# Patient Record
Sex: Male | Born: 1955 | Race: White | Hispanic: No | Marital: Married | Smoking: Former smoker
Health system: Southern US, Community
[De-identification: ages and names within clinical notes are randomized; demographics above are authoritative.]

## PROBLEM LIST (undated history)

## (undated) DIAGNOSIS — F528 Other sexual dysfunction not due to a substance or known physiological condition: Secondary | ICD-10-CM

## (undated) DIAGNOSIS — E785 Hyperlipidemia, unspecified: Secondary | ICD-10-CM

## (undated) DIAGNOSIS — M1990 Unspecified osteoarthritis, unspecified site: Secondary | ICD-10-CM

## (undated) DIAGNOSIS — L408 Other psoriasis: Secondary | ICD-10-CM

## (undated) DIAGNOSIS — I4510 Unspecified right bundle-branch block: Secondary | ICD-10-CM

## (undated) DIAGNOSIS — L4050 Arthropathic psoriasis, unspecified: Secondary | ICD-10-CM

## (undated) DIAGNOSIS — G4700 Insomnia, unspecified: Secondary | ICD-10-CM

## (undated) DIAGNOSIS — E039 Hypothyroidism, unspecified: Principal | ICD-10-CM

## (undated) DIAGNOSIS — B1710 Acute hepatitis C without hepatic coma: Secondary | ICD-10-CM

## (undated) HISTORY — DX: Hyperlipidemia, unspecified: E78.5

## (undated) HISTORY — DX: Other sexual dysfunction not due to a substance or known physiological condition: F52.8

## (undated) HISTORY — DX: Other psoriasis: L40.8

## (undated) HISTORY — DX: Acute hepatitis C without hepatic coma: B17.10

## (undated) HISTORY — DX: Unspecified osteoarthritis, unspecified site: M19.90

## (undated) HISTORY — DX: Unspecified right bundle-branch block: I45.10

## (undated) HISTORY — DX: Hypothyroidism, unspecified: E03.9

## (undated) HISTORY — DX: Insomnia, unspecified: G47.00

## (undated) HISTORY — DX: Arthropathic psoriasis, unspecified: L40.50

---

## 2005-06-10 ENCOUNTER — Ambulatory Visit: Payer: Self-pay | Admitting: Internal Medicine

## 2005-06-13 ENCOUNTER — Ambulatory Visit: Payer: Self-pay | Admitting: Internal Medicine

## 2005-11-08 ENCOUNTER — Ambulatory Visit: Payer: Self-pay | Admitting: Endocrinology

## 2006-05-30 ENCOUNTER — Ambulatory Visit: Payer: Self-pay | Admitting: Gastroenterology

## 2006-05-31 ENCOUNTER — Ambulatory Visit: Payer: Self-pay | Admitting: Internal Medicine

## 2006-06-19 ENCOUNTER — Ambulatory Visit: Payer: Self-pay | Admitting: Internal Medicine

## 2006-06-19 LAB — CONVERTED CEMR LAB
ALT: 28 units/L (ref 0–40)
AST: 23 units/L (ref 0–37)
Basophils Absolute: 0.1 10*3/uL (ref 0.0–0.1)
Bilirubin Urine: NEGATIVE
Calcium: 9.9 mg/dL (ref 8.4–10.5)
Chloride: 105 meq/L (ref 96–112)
Creatinine, Ser: 0.8 mg/dL (ref 0.4–1.5)
Crystals: NEGATIVE
GFR calc Af Amer: 131 mL/min
GFR calc non Af Amer: 108 mL/min
Hemoglobin, Urine: NEGATIVE
Hemoglobin: 16.2 g/dL (ref 13.0–17.0)
Hgb A1c MFr Bld: 5.6 % (ref 4.6–6.0)
Lymphocytes Relative: 13.2 % (ref 12.0–46.0)
MCHC: 33.8 g/dL (ref 30.0–36.0)
MCV: 88 fL (ref 78.0–100.0)
Monocytes Absolute: 0.8 10*3/uL — ABNORMAL HIGH (ref 0.2–0.7)
Monocytes Relative: 9.6 % (ref 3.0–11.0)
Neutro Abs: 5.8 10*3/uL (ref 1.4–7.7)
Neutrophils Relative %: 73.8 % (ref 43.0–77.0)
Nitrite: NEGATIVE
Potassium: 3.9 meq/L (ref 3.5–5.1)
Sed Rate: 10 mm/hr (ref 0–20)
Sodium: 143 meq/L (ref 135–145)
Specific Gravity, Urine: >=1.03 (ref 1.000–1.03)
TSH: 1.46 microintl units/mL (ref 0.35–5.50)
Total Protein, Urine: 30 mg/dL — AB
Urine Glucose: NEGATIVE mg/dL
Urobilinogen, UA: 0.2 (ref 0.0–1.0)

## 2006-07-21 ENCOUNTER — Ambulatory Visit: Payer: Self-pay | Admitting: Gastroenterology

## 2007-02-12 ENCOUNTER — Encounter: Payer: Self-pay | Admitting: *Deleted

## 2007-02-12 DIAGNOSIS — L408 Other psoriasis: Secondary | ICD-10-CM

## 2007-02-12 DIAGNOSIS — F528 Other sexual dysfunction not due to a substance or known physiological condition: Secondary | ICD-10-CM | POA: Insufficient documentation

## 2007-02-12 DIAGNOSIS — B1710 Acute hepatitis C without hepatic coma: Secondary | ICD-10-CM

## 2007-02-12 DIAGNOSIS — B182 Chronic viral hepatitis C: Secondary | ICD-10-CM

## 2007-02-12 DIAGNOSIS — B338 Other specified viral diseases: Secondary | ICD-10-CM

## 2007-02-12 HISTORY — DX: Acute hepatitis C without hepatic coma: B17.10

## 2007-02-12 HISTORY — DX: Other psoriasis: L40.8

## 2007-02-12 HISTORY — DX: Other sexual dysfunction not due to a substance or known physiological condition: F52.8

## 2007-03-16 ENCOUNTER — Telehealth: Payer: Self-pay | Admitting: Internal Medicine

## 2007-04-23 ENCOUNTER — Ambulatory Visit: Payer: Self-pay | Admitting: Internal Medicine

## 2007-04-23 DIAGNOSIS — L4050 Arthropathic psoriasis, unspecified: Secondary | ICD-10-CM | POA: Insufficient documentation

## 2007-04-23 DIAGNOSIS — E785 Hyperlipidemia, unspecified: Secondary | ICD-10-CM

## 2007-04-23 DIAGNOSIS — E039 Hypothyroidism, unspecified: Secondary | ICD-10-CM

## 2007-04-23 HISTORY — DX: Arthropathic psoriasis, unspecified: L40.50

## 2007-04-23 HISTORY — DX: Hypothyroidism, unspecified: E03.9

## 2007-07-18 ENCOUNTER — Ambulatory Visit: Payer: Self-pay | Admitting: Internal Medicine

## 2007-07-25 ENCOUNTER — Encounter: Payer: Self-pay | Admitting: Internal Medicine

## 2007-08-02 LAB — CONVERTED CEMR LAB
AST: 23 units/L (ref 0–37)
Albumin: 4 g/dL (ref 3.5–5.2)
Alkaline Phosphatase: 76 units/L (ref 39–117)
Basophils Absolute: 0.1 10*3/uL (ref 0.0–0.1)
Basophils Relative: 0.7 % (ref 0.0–1.0)
CO2: 31 meq/L (ref 19–32)
Chloride: 103 meq/L (ref 96–112)
Creatinine, Ser: 0.8 mg/dL (ref 0.4–1.5)
HCT: 47.3 % (ref 39.0–52.0)
HDL: 34.1 mg/dL — ABNORMAL LOW (ref >39.0)
Hemoglobin, Urine: NEGATIVE
Hemoglobin: 15.4 g/dL (ref 13.0–17.0)
Ketones, ur: NEGATIVE mg/dL
LDL Cholesterol: 151 mg/dL — ABNORMAL HIGH (ref 0–99)
Leukocytes, UA: NEGATIVE
MCHC: 32.6 g/dL (ref 30.0–36.0)
Monocytes Absolute: 0.8 10*3/uL — ABNORMAL HIGH (ref 0.2–0.7)
Neutrophils Relative %: 63.4 % (ref 43.0–77.0)
Nitrite: NEGATIVE
RBC: 5.36 M/uL (ref 4.22–5.81)
RDW: 13.1 % (ref 11.5–14.6)
Sodium: 138 meq/L (ref 135–145)
Specific Gravity, Urine: 1.025 (ref 1.000–1.03)
Total Bilirubin: 1 mg/dL (ref 0.3–1.2)
Total Protein, Urine: NEGATIVE mg/dL
Total Protein: 7.3 g/dL (ref 6.0–8.3)
Urobilinogen, UA: 0.2 (ref 0.0–1.0)
VLDL: 13 mg/dL (ref 0–40)
WBC: 7.9 10*3/uL (ref 4.5–10.5)
pH: 5.5 (ref 5.0–8.0)

## 2007-08-15 ENCOUNTER — Ambulatory Visit: Payer: Self-pay | Admitting: Internal Medicine

## 2007-08-15 DIAGNOSIS — I4510 Unspecified right bundle-branch block: Secondary | ICD-10-CM

## 2007-08-15 HISTORY — DX: Unspecified right bundle-branch block: I45.10

## 2007-08-28 ENCOUNTER — Encounter: Admission: RE | Admit: 2007-08-28 | Discharge: 2007-08-28 | Payer: Self-pay | Admitting: Internal Medicine

## 2007-09-10 ENCOUNTER — Encounter: Payer: Self-pay | Admitting: Internal Medicine

## 2008-02-15 ENCOUNTER — Ambulatory Visit: Payer: Self-pay | Admitting: Internal Medicine

## 2008-04-11 ENCOUNTER — Ambulatory Visit: Payer: Self-pay | Admitting: Internal Medicine

## 2008-06-12 ENCOUNTER — Ambulatory Visit: Payer: Self-pay | Admitting: Internal Medicine

## 2008-06-12 DIAGNOSIS — G4700 Insomnia, unspecified: Secondary | ICD-10-CM | POA: Insufficient documentation

## 2008-06-12 HISTORY — DX: Insomnia, unspecified: G47.00

## 2008-06-26 ENCOUNTER — Telehealth: Payer: Self-pay | Admitting: Internal Medicine

## 2008-11-10 ENCOUNTER — Telehealth: Payer: Self-pay | Admitting: Internal Medicine

## 2009-03-17 ENCOUNTER — Ambulatory Visit: Payer: Self-pay | Admitting: Internal Medicine

## 2009-03-17 LAB — CONVERTED CEMR LAB
ALT: 34 units/L (ref 0–53)
AST: 30 units/L (ref 0–37)
Alkaline Phosphatase: 73 units/L (ref 39–117)
Basophils Relative: 0.9 % (ref 0.0–3.0)
Bilirubin Urine: NEGATIVE
Bilirubin, Direct: 0.1 mg/dL (ref 0.0–0.3)
Calcium: 9.3 mg/dL (ref 8.4–10.5)
Creatinine, Ser: 0.6 mg/dL (ref 0.4–1.5)
Eosinophils Absolute: 0.3 10*3/uL (ref 0.0–0.7)
Eosinophils Relative: 3.6 % (ref 0.0–5.0)
GFR calc non Af Amer: 148.56 mL/min (ref >60)
HDL: 38.3 mg/dL — ABNORMAL LOW (ref >39.00)
Hemoglobin: 15.3 g/dL (ref 13.0–17.0)
LDL Cholesterol: 132 mg/dL — ABNORMAL HIGH (ref 0–99)
Lymphocytes Relative: 23.4 % (ref 12.0–46.0)
Monocytes Relative: 8.8 % (ref 3.0–12.0)
Neutrophils Relative %: 63.3 % (ref 43.0–77.0)
Nitrite: NEGATIVE
RBC: 4.86 M/uL (ref 4.22–5.81)
Sodium: 139 meq/L (ref 135–145)
Total CHOL/HDL Ratio: 5
Total Protein, Urine: NEGATIVE mg/dL
Total Protein: 7.1 g/dL (ref 6.0–8.3)
Triglycerides: 61 mg/dL (ref 0.0–149.0)
Urine Glucose: NEGATIVE mg/dL
WBC: 7.4 10*3/uL (ref 4.5–10.5)
pH: 5.5 (ref 5.0–8.0)

## 2009-03-20 ENCOUNTER — Ambulatory Visit: Payer: Self-pay | Admitting: Internal Medicine

## 2009-03-20 DIAGNOSIS — Z87891 Personal history of nicotine dependence: Secondary | ICD-10-CM

## 2009-03-20 DIAGNOSIS — H6120 Impacted cerumen, unspecified ear: Secondary | ICD-10-CM

## 2009-04-09 ENCOUNTER — Telehealth (INDEPENDENT_AMBULATORY_CARE_PROVIDER_SITE_OTHER): Payer: Self-pay | Admitting: *Deleted

## 2009-09-03 ENCOUNTER — Telehealth: Payer: Self-pay | Admitting: Internal Medicine

## 2009-09-04 ENCOUNTER — Telehealth: Payer: Self-pay | Admitting: Internal Medicine

## 2009-11-26 ENCOUNTER — Ambulatory Visit: Payer: Self-pay | Admitting: Internal Medicine

## 2010-05-12 ENCOUNTER — Ambulatory Visit: Payer: Self-pay | Admitting: Internal Medicine

## 2010-06-13 ENCOUNTER — Encounter: Payer: Self-pay | Admitting: Internal Medicine

## 2010-06-22 NOTE — Assessment & Plan Note (Signed)
Summary: COLD/ AVP'S PT/NWS   Vital Signs:  Patient profile:   55 year old male Height:      66 inches Weight:      183.75 pounds BMI:     29.77 O2 Sat:      96 % on Room air Temp:     98.3 degrees F oral Pulse rate:   76 / minute BP sitting:   130 / 78  (left arm) Cuff size:   regular  Vitals Entered By: Zella Ball Ewing CMA (AAMA) (November 26, 2009 1:20 PM)  O2 Flow:  Room air CC: cough, congestion, sneezing/RE   CC:  cough, congestion, and sneezing/RE.  History of Present Illness: here with acute onset x 2 days facial pain, pressure, fever and greneish d/c;  has mild sT, but Pt denies CP, sob, doe, wheezing, orthopnea, pnd, worsening LE edema, palps, dizziness or syncope  Pt denies new neuro symptoms such as headache, facial or extremity weakness   Problems Prior to Update: 1)  Sinusitis- Acute-nos  (ICD-461.9) 2)  Cerumen Impaction  (ICD-380.4) 3)  Tobacco Use, Quit  (ICD-V15.82) 4)  Insomnia, Persistent  (ICD-307.42) 5)  Sinusitis, Acute  (ICD-461.9) 6)  Bundle Branch Block, Right  (ICD-426.4) 7)  Observation For Suspected Malignant Neoplasm  (ICD-V71.1) 8)  Routine General Medical Exam@health  [facility]  (ICD-V70.0) 9)  Hyperlipidemia  (ICD-272.4) 10)  Erectile Dysfunction  (ICD-302.72) 11)  Psoriatic Arthritis  (ICD-696.0) 12)  Psoriasis  (ICD-696.1) 13)  Hypothyroidism  (ICD-244.9) 14)  Sinusitis- Acute-nos  (ICD-461.9) 15)  Erectile Dysfunction  (ICD-302.72) 16)  Arthritis W/viral Disease, 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 1 By Mouth Daily 2)  Aspirin 81 Mg  Tbec (Aspirin) .... One By Mouth Every Day 3)  Fish Oil   Oil (Fish Oil) .... 2 By Mouth Two Times A Day 4)  Clobetasol Propionate 0.05 % Crea (Clobetasol Propionate) .... Two Times A Day 5)  Cormax 0.05 % Soln (Clobetasol Propionate) .... Use  Two Times A Day Prn 6)  Viagra  100 Mg Tabs (Sildenafil Citrate) .[name].. 1 By Mouth Once Daily Prn 7)  Levothyroxine Sodium 137 Mcg  Tabs (Levothyroxine Sodium) .[name].. 1 By Mouth Daily  Current Medications (verified): 1)  Vitamin D3 1000 Unit  Tabs (Cholecalciferol) .[name].. 1 By Mouth Daily 2)  Aspirin 81 Mg  Tbec (Aspirin) .... One By Mouth Every Day 3)  Fish Oil   Oil (Fish Oil) .... 2 By Mouth Two Times A Day 4)  Clobetasol Propionate 0.05 % Crea (Clobetasol Propionate) .... Two Times A Day 5)  Cormax 0.05 % Soln (Clobetasol Propionate) .... Use  Two Times A Day Prn 6)  Viagra 100 Mg Tabs (Sildenafil Citrate) .[name].. 1 By Mouth Once Daily Prn 7)  Levothyroxine Sodium 137 Mcg  Tabs (Levothyroxine Sodium) .[name].. 1 By Mouth Daily 8)  Clarithromycin 500 Mg Tabs (Clarithromycin) .[name].. 1 By Mouth Two Times A Day  Allergies (verified): 1)  Mevacor  Past History:  Past Medical History: Last updated: 03/20/2009 Hepatitis C Hypothyroidism psoriasis psoriatic arthritis E.D. RBBB(present for long time) Hyperlipidemia  Past Surgical History: Last updated: 04/23/2007 Denies surgical history  Social History: Last updated: 02/15/2008 Former Smoker Alcohol use-no Occupation: pilot  Risk Factors: Smoking Status: quit (03/20/2009)  Review of Systems       all otherwise negative per pt -  Physical Exam  General:  alert and overweight-appearing.  mild ill  Head:  normocephalic and atraumatic.   Eyes:  vision grossly intact, pupils equal, and pupils round.   Ears:  bilat tm's red, sinus tender bilat Nose:  nasal dischargemucosal pallor and mucosal edema.   Mouth:  pharyngeal erythema and fair dentition.   Neck:  supple and cervical lymphadenopathy.   Lungs:  normal respiratory effort and normal breath sounds.   Heart:  normal rate and regular rhythm.   Extremities:  no edema, no erythema    Impression & Recommendations:  Problem # 1:  SINUSITIS- ACUTE-NOS (ICD-461.9)  His updated medication list for this problem  includes:    Clarithromycin 500 Mg Tabs (Clarithromycin) .[name]... 1 by mouth two times a day treat as above, f/u any worsening signs or symptoms   Complete Medication List: 1)  Vitamin D3 1000 Unit Tabs (Cholecalciferol) .[name].. 1 by mouth daily 2)  Aspirin 81 Mg Tbec (Aspirin) .... One by mouth every day 3)  Fish Oil Oil (Fish oil) .... 2 by mouth two times a day 4)  Clobetasol Propionate 0.05 % Crea (Clobetasol propionate) .... Two times a day 5)  Cormax 0.05 % Soln (Clobetasol propionate) .... Use  two times a day prn 6)  Viagra 100 Mg Tabs (Sildenafil citrate) .[name].. 1 by mouth once daily prn 7)  Levothyroxine Sodium 137 Mcg Tabs (Levothyroxine sodium) .[name].. 1 by mouth daily 8)  Clarithromycin 500 Mg Tabs (Clarithromycin) .[name].. 1 by mouth two times a day  Patient Instructions: 1)  Please take all new medications as prescribed 2)  Continue all previous medications as before this visit  3)  You can also use Mucinex OTC or it's generic for congestion  4)  Please schedule an appointment with your primary doctor as needed Prescriptions: CLARITHROMYCIN 500 MG TABS (CLARITHROMYCIN) 1 by mouth two times a day  #20 x 0   Entered and Authorized by:   Corwin Levins MD   Signed by:   Corwin Levins MD on 11/26/2009   Method used:   Print then Give to Patient   RxID:   [id number]

## 2010-06-22 NOTE — Progress Notes (Signed)
Summary: Viagra refill  Phone Note Refill Request Message from:  Fax from Pharmacy on September 03, 2009 4:58 PM  Refills Requested: Medication #1:  VIAGRA 100 MG TABS 1 by mouth once daily prn Initial call taken by: Lucious Groves,  September 03, 2009 4:58 PM

## 2010-06-22 NOTE — Progress Notes (Signed)
Summary: Viagra refill  Phone Note Refill Request Message from:  Fax from Pharmacy on September 04, 2009 8:29 AM  Refills Requested: Medication #1:  VIAGRA 100 MG TABS 1 by mouth once daily prn Initial call taken by: Lucious Groves,  September 04, 2009 8:29 AM  Follow-up for Phone Call        OK x12 Follow-up by: Tresa Garter MD,  September 04, 2009 1:12 PM    Prescriptions: VIAGRA 100 MG TABS (SILDENAFIL CITRATE) 1 by mouth once daily prn  #12 x 12   Entered by:   Lamar Sprinkles, CMA   Authorized by:   Tresa Garter MD   Signed by:   Lamar Sprinkles, CMA on 09/04/2009   Method used:   [facility] [address]       [address]       [place], [place]  [address]       Ph: [phone] or [phone]       Fax: [phone]   RxID:   [id number]

## 2010-06-24 NOTE — Assessment & Plan Note (Signed)
Summary: CONGESTION--RUNNY NOSE   STC   Vital Signs:  Patient profile:   55 year old male Height:      66 inches Weight:      192 pounds BMI:     31.10 Temp:     98.9 degrees F oral Pulse rate:   80 / minute Pulse rhythm:   regular Resp:     16 per minute BP sitting:   138 / 90  (left arm) Cuff size:   regular  Vitals Entered By: Lanier Prude, Beverly Gust) (May 12, 2010 10:29 AM) CC: sore throat, sinus congestion X 3-4 days Is Patient Diabetic? No   CC:  sore throat and sinus congestion X 3-4 days.  History of Present Illness: The patient presents with complaints of sore throat, fever, cough, sinus congestion and drainge of several days duration. Not better with OTC meds. Chest hurts with coughing. The mucus is colored.   Current Medications (verified): 1)  Vitamin D3 1000 Unit  Tabs (Cholecalciferol) .[name].. 1 By Mouth Daily 2)  Aspirin 81 Mg  Tbec (Aspirin) .... One By Mouth Every Day 3)  Fish Oil   Oil (Fish Oil) .... 2 By Mouth Two Times A Day 4)  Clobetasol Propionate 0.05 % Crea (Clobetasol Propionate) .... Two Times A Day 5)  Cormax 0.05 % Soln (Clobetasol Propionate) .... Use  Two Times A Day Prn 6)  Viagra 100 Mg Tabs (Sildenafil Citrate) .[name].. 1 By Mouth Once Daily Prn 7)  Levothyroxine Sodium 137 Mcg  Tabs (Levothyroxine Sodium) .[name].. 1 By Mouth Daily 8)  Clarithromycin 500 Mg Tabs (Clarithromycin) .[name].. 1 By Mouth Two Times A Day  Allergies (verified): 1)  Mevacor  Review of Systems  The patient denies chest pain, dyspnea on exertion, and prolonged cough.         ST  Physical Exam  General:  NAD Mouth:  Erythematous throat and intranasal mucosa c/w URI  Lungs:  normal respiratory effort and normal breath sounds.   Heart:  normal rate and regular rhythm.   Abdomen:  Bowel sounds positive,abdomen soft and non-tender without masses, organomegaly or hernias noted. Psych:  Cognition and judgment appear intact. Alert and cooperative with normal attention span  and concentration. No apparent delusions, illusions, hallucinations   Impression & Recommendations:  Problem # 1:  SINUSITIS- ACUTE-NOS (ICD-461.9) Assessment New  The following medications were removed from the medication list:    Clarithromycin 500 Mg Tabs (Clarithromycin) .[name]... 1 by mouth two times a day His updated medication list for this problem includes:    Sudafed 12 Hour 120 Mg Xr12h-tab (Pseudoephedrine hcl) .[name]... 1 by mouth two times a day as needed allergies    Zithromax Z-pak 250 Mg Tabs (Azithromycin) .[name]... As dirrected  Complete Medication List: 1)  Vitamin D3 1000 Unit Tabs (Cholecalciferol) .[name].. 1 by mouth daily 2)  Aspirin 81 Mg Tbec (Aspirin) .... One by mouth every day 3)  Fish Oil Oil (Fish oil) .... 2 by mouth two times a day 4)  Clobetasol Propionate 0.05 % Crea (Clobetasol propionate) .... Two times a day 5)  Cormax 0.05 % Soln (Clobetasol propionate) .... Use  two times a day prn 6)  Viagra 100 Mg Tabs (Sildenafil citrate) .[name].. 1 by mouth once daily prn 7)  Levothyroxine Sodium 137 Mcg Tabs (Levothyroxine sodium) .[name].. 1 by mouth daily 8)  Sudafed 12 Hour 120 Mg Xr12h-tab (Pseudoephedrine hcl) .[name].. 1 by mouth two times a day as needed allergies 9)  Zithromax Z-pak 250 Mg  Tabs (Azithromycin) .... As dirrected  Patient Instructions: 1)  Use over-the-counter medicines for "cold": Tylenol  650mg  or Advil 400mg  every 6 hours  for fever; Delsym or Robutussin for cough. Mucinex for congestion. Ricola or Halls for sore throat. Office visit if not better or if worse.  Prescriptions: ZITHROMAX Z-PAK 250 MG TABS (AZITHROMYCIN) as dirrected  #1 x 0   Entered and Authorized by:   Tresa Garter MD   Signed by:   Tresa Garter MD on 05/12/2010   Method used:   Electronically to        [facility] [address] [phone][facility] (retail)       [address] [place], [place]  [address]       Ph: [phone]       Fax: [phone]   RxID:   [id number] SUDAFED 12 HOUR 120  MG XR12H-TAB (PSEUDOEPHEDRINE HCL) 1 by mouth two times a day as needed allergies  #60 x 1   Entered and Authorized by:   Tresa Garter MD   Signed by:   Tresa Garter MD on 05/12/2010   Method used:   Electronically to        [facility] [address] [phone]* (retail)       [address] [place], [place]  [address]       Ph: [phone]       Fax: [phone]   RxID:   [id number]    Orders Added: 1)  Est. Patient Level III [[address]]

## 2010-06-29 ENCOUNTER — Telehealth: Payer: Self-pay | Admitting: Internal Medicine

## 2010-07-08 NOTE — Progress Notes (Signed)
Summary: RF?   Phone Note Call from Patient Call back at [facility] Phone [phone]   Summary of Call: Patient is requesting refill of thyroid med. Last labs were 02/2009.  Initial call taken by: Lamar Sprinkles, CMA,  June 29, 2010 9:29 AM  Follow-up for Phone Call        ok x 3 months Sch CPX w/labs Thank you!  Follow-up by: Tresa Garter MD,  June 29, 2010 10:41 AM    Prescriptions: LEVOTHYROXINE SODIUM 137 MCG  TABS (LEVOTHYROXINE SODIUM) 1 by mouth daily  #30 x 3   Entered by:   Rock Nephew CMA   Authorized by:   Tresa Garter MD   Signed by:   Rock Nephew CMA on 06/29/2010   Method used:   [facility]  [place] [address]       [address]       [place], [place]  [address]       Ph: [phone] or [phone]       Fax: [phone]   RxID:   [id number]

## 2010-08-18 ENCOUNTER — Telehealth: Payer: Self-pay | Admitting: *Deleted

## 2010-08-18 NOTE — Telephone Encounter (Signed)
Patient requesting 90 day supply of Levothyroxine 137 with rfs to be mailed to pt's home. OK? Last time rx was requested you noted pt needed cpx. Ok for #90 w/0rfs?

## 2010-08-19 NOTE — Telephone Encounter (Signed)
OK #90 Thank you!

## 2010-08-20 MED ORDER — LEVOTHYROXINE SODIUM 137 MCG PO TABS: 137.0000 ug | ORAL_TABLET | Freq: Every day | ORAL | Status: DC

## 2010-08-20 NOTE — Telephone Encounter (Signed)
Mailed to pt

## 2010-10-08 NOTE — Assessment & Plan Note (Signed)
Eric Griffith                           PRIMARY CARE OFFICE NOTE   Eric Griffith, Eric Griffith                MRN:          [id number]  DATE:06/19/2006                            DOB:          Mar 02, 1956    SEPARATE EVALUATION AND MANAGEMENT:   We need to address today problems with psoriasis, arthropathy, and  hepatitis C.  Eric Griffith is complaining of erectile dysfunction of  several months' duration.  Since Saturday has been sick with sore  throat, voice loss, cough productive for yellow sputum.  Past medical  history, family history, social history as per June 13, 2005, note.   ALLERGIES:  No known drug allergies.   CURRENT MEDICINES:  Reviewed with the patient.   REVIEW OF SYSTEMS:  As above; the rest of 18-point system review of  systems is negative.   PHYSICAL EXAMINATION:  GENERAL:  He looks well.  He is in no acute  distress.  VITAL SIGNS:  Blood pressure 138/92, pulse 72, temperature 97.2, weight  192 pounds.  HEENT:  Moist mucosa.  NECK:  Supple, no thyromegaly or bruit.  LUNGS:  Clear, no wheezes or rales.  HEART:  S1, S2.  No murmur, no gallop.  ABDOMEN:  Soft, nontender, no organomegaly or mass felt.  LOWER EXTREMITIES:  Without edema.  NEUROLOGIC:  He is alert, oriented and cooperative.  Denies being  depressed.  SKIN:  Examination reveals confluent patches over  with scaling between  the buttocks, on the lower extremities, some on the scalp.  JOINTS:  Without deformities.  RECTAL:  Examination reveals normal-size prostate, stool guaiac  negative, no masses.   LABORATORY:  EKG with right bundle-branch block, normal sinus rhythm.   ASSESSMENT AND PLAN:  1. 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  He is status post      rheumatologic evaluation with Dr. Phylliss Bob.  He is doing very well on      prednisone 5 mg daily.  My only concern is his chronic hepatitis C      here.  I gave him 1-mg tablets and asked him to take the  lowest      dose that controls his symptoms.  2. Hepatitis C.  He was seen at [facility].  He will discuss the      issue with Dr. Phylliss Bob.  3. Psoriasis.  He is seeing Dr. Terri Piedra today, using topical treatment.  4. Erectile dysfunction.  Prescribed Levitra 20 mg one-half to one      daily p.r.n.  I also gave him samples of Cialis 20 mg every 3 days      p.r.n. to see what works better.  5. Upper respiratory infection.  I gave him a prescription for a Z-Pak      for 5 days if starts to cough up green sputum.  If he is getting      better, he will not take it.     Georgina Quint. Plotnikov, MD  Electronically Signed    AVP/MedQ  DD: 06/19/2006  DT: 06/19/2006  Job #: [id number]   cc:   Areatha Keas, M.D.  Frederick A. Terri Piedra III,  M.D. 

## 2010-10-08 NOTE — Assessment & Plan Note (Signed)
[facility]                           PRIMARY CARE OFFICE NOTE   Eric Griffith, Eric Griffith                MRN:          [id number]  DATE:06/19/2006                            DOB:          1956/02/27    The patient is a 55 year old male who presents for a wellness  examination.  Past medical history, family history, social history as  per June 13, 2005, note.   CURRENT MEDICINES:  1. Synthroid 160 mcg daily.  2. Prednisone 5 mg daily.   ALLERGIES:  No known drug allergies.   REVIEW OF SYSTEMS:  Arthropathies, much better on prednisone.  Continues  to have persistent skin lesions.  Energy level is good.  No GI  complaints.  No vision problems.  No dry mouth.  Gets up at night once.  The rest of the 18-point review of systems is negative.   ALLERGIES:  No known drug allergies.   PHYSICAL EXAMINATION:  GENERAL:  He looks well.  He is in no acute  distress.  VITAL SIGNS:  Blood pressure 138/92, pulse 72, temperature 97.2, weight  192 pounds.  HEENT:  Moist mucosa.  NECK:  Supple, no thyromegaly or bruit.  LUNGS:  Clear, no wheezes or rales.  HEART:  S1, S2.  No murmur, no gallop.  ABDOMEN:  Soft, nontender, no organomegaly or mass felt.  LOWER EXTREMITIES:  Without edema.  NEUROLOGIC:  He is alert, oriented and cooperative.  Denies being  depressed.  SKIN:  Examination reveals confluent patches over edema with scaling  between the buttocks, on the lower extremities, some on the scalp.  JOINTS:  Without deformities.  RECTAL:  Examination reveals normal-size prostate, stool guaiac  negative, no masses.   ASSESSMENT AND PLAN:  Normal wellness examination.  Age/health-related  issues discussed.  Healthy lifestyle discussed.  Baby aspirin daily.  He  will see Dr. Arlyce Dice for a colonoscopy in the near future.  His EKG today  reveals normal sinus rhythm with a right bundle-branch block.  Repeat  exam in 12 months.     Georgina Quint. Plotnikov,  MD  Electronically Signed    AVP/MedQ  DD: 06/19/2006  DT: 06/19/2006  Job #: [id number]

## 2011-03-25 ENCOUNTER — Other Ambulatory Visit: Payer: Self-pay | Admitting: Internal Medicine

## 2011-03-25 MED ORDER — LEVOTHYROXINE SODIUM 137 MCG PO TABS: 137.0000 ug | ORAL_TABLET | Freq: Every day | ORAL | Status: DC

## 2011-03-25 NOTE — Telephone Encounter (Signed)
Left detailed mess informing pt rf sent.

## 2011-03-25 NOTE — Telephone Encounter (Signed)
The pt is requesting a med refill of levothyroxine to the [facility] in [place] to hold him over until his cpe appt on 1/03.   Thanks!

## 2011-05-23 DIAGNOSIS — E039 Hypothyroidism, unspecified: Secondary | ICD-10-CM

## 2011-05-26 ENCOUNTER — Ambulatory Visit (INDEPENDENT_AMBULATORY_CARE_PROVIDER_SITE_OTHER): Payer: Federal, State, Local not specified - PPO | Admitting: Internal Medicine

## 2011-05-26 ENCOUNTER — Other Ambulatory Visit: Payer: Self-pay | Admitting: *Deleted

## 2011-05-26 ENCOUNTER — Telehealth: Payer: Self-pay | Admitting: *Deleted

## 2011-05-26 ENCOUNTER — Encounter: Payer: Self-pay | Admitting: Internal Medicine

## 2011-05-26 ENCOUNTER — Other Ambulatory Visit: Payer: Self-pay | Admitting: Internal Medicine

## 2011-05-26 ENCOUNTER — Other Ambulatory Visit (INDEPENDENT_AMBULATORY_CARE_PROVIDER_SITE_OTHER): Payer: Federal, State, Local not specified - PPO

## 2011-05-26 VITALS — BP 140/90 | HR 80 | Temp 97.9°F | Resp 16 | Wt 192.0 lb

## 2011-05-26 DIAGNOSIS — J329 Chronic sinusitis, unspecified: Secondary | ICD-10-CM

## 2011-05-26 DIAGNOSIS — N529 Male erectile dysfunction, unspecified: Secondary | ICD-10-CM

## 2011-05-26 DIAGNOSIS — Z125 Encounter for screening for malignant neoplasm of prostate: Secondary | ICD-10-CM

## 2011-05-26 DIAGNOSIS — Z23 Encounter for immunization: Secondary | ICD-10-CM

## 2011-05-26 DIAGNOSIS — R030 Elevated blood-pressure reading, without diagnosis of hypertension: Secondary | ICD-10-CM | POA: Insufficient documentation

## 2011-05-26 DIAGNOSIS — Z2911 Encounter for prophylactic immunotherapy for respiratory syncytial virus (RSV): Secondary | ICD-10-CM

## 2011-05-26 DIAGNOSIS — Z0000 Encounter for general adult medical examination without abnormal findings: Secondary | ICD-10-CM

## 2011-05-26 DIAGNOSIS — L408 Other psoriasis: Secondary | ICD-10-CM

## 2011-05-26 DIAGNOSIS — N400 Enlarged prostate without lower urinary tract symptoms: Secondary | ICD-10-CM

## 2011-05-26 LAB — CBC WITH DIFFERENTIAL/PLATELET
Basophils Absolute: 0.1 K/uL (ref 0.0–0.1)
Basophils Relative: 0.8 % (ref 0.0–3.0)
Eosinophils Absolute: 0.7 K/uL (ref 0.0–0.7)
Eosinophils Relative: 7.2 % — ABNORMAL HIGH (ref 0.0–5.0)
HCT: 48 % (ref 39.0–52.0)
Hemoglobin: 16.3 g/dL (ref 13.0–17.0)
Lymphocytes Relative: 23.1 % (ref 12.0–46.0)
Lymphs Abs: 2.1 K/uL (ref 0.7–4.0)
MCHC: 33.9 g/dL (ref 30.0–36.0)
MCV: 89.9 fl (ref 78.0–100.0)
Monocytes Absolute: 0.8 K/uL (ref 0.1–1.0)
Monocytes Relative: 8.7 % (ref 3.0–12.0)
Neutro Abs: 5.5 K/uL (ref 1.4–7.7)
Neutrophils Relative %: 60.2 % (ref 43.0–77.0)
Platelets: 295 K/uL (ref 150.0–400.0)
RBC: 5.34 Mil/uL (ref 4.22–5.81)
RDW: 13.6 % (ref 11.5–14.6)
WBC: 9.1 K/uL (ref 4.5–10.5)

## 2011-05-26 LAB — LIPID PANEL
Cholesterol: 219 mg/dL — ABNORMAL HIGH (ref 0–200)
HDL: 41.2 mg/dL
Total CHOL/HDL Ratio: 5
Triglycerides: 96 mg/dL (ref 0.0–149.0)
VLDL: 19.2 mg/dL (ref 0.0–40.0)

## 2011-05-26 LAB — HEPATIC FUNCTION PANEL
ALT: 45 U/L (ref 0–53)
Alkaline Phosphatase: 78 U/L (ref 39–117)
Bilirubin, Direct: 0.2 mg/dL (ref 0.0–0.3)
Total Protein: 7.4 g/dL (ref 6.0–8.3)

## 2011-05-26 LAB — BASIC METABOLIC PANEL
CO2: 28 mEq/L (ref 19–32)
Chloride: 104 mEq/L (ref 96–112)
Potassium: 4.3 mEq/L (ref 3.5–5.1)
Sodium: 140 mEq/L (ref 135–145)

## 2011-05-26 LAB — PSA: PSA: 1.58 ng/mL (ref 0.10–4.00)

## 2011-05-26 LAB — URINALYSIS, ROUTINE W REFLEX MICROSCOPIC
Bilirubin Urine: NEGATIVE
Leukocytes, UA: NEGATIVE
Nitrite: NEGATIVE
Specific Gravity, Urine: 1.025 (ref 1.000–1.030)
Total Protein, Urine: NEGATIVE

## 2011-05-26 LAB — TSH: TSH: 0.36 u[IU]/mL (ref 0.35–5.50)

## 2011-05-26 MED ORDER — KETOCONAZOLE 2 % EX CREA: TOPICAL_CREAM | Freq: Every day | CUTANEOUS | Status: DC

## 2011-05-26 MED ORDER — FLUTICASONE PROPIONATE 50 MCG/ACT NA SUSP: 2.0000 | Freq: Every day | NASAL | Status: DC

## 2011-05-26 MED ORDER — TADALAFIL 5 MG PO TABS: 5.0000 mg | ORAL_TABLET | Freq: Every day | ORAL | Status: AC | PRN

## 2011-05-26 MED ORDER — AMOXICILLIN-POT CLAVULANATE 875-125 MG PO TABS: 1.0000 | ORAL_TABLET | Freq: Two times a day (BID) | ORAL | Status: AC

## 2011-05-26 NOTE — Assessment & Plan Note (Signed)
Chronic. Intertrigo (buttocks)-worse. Trial of ketoconazole

## 2011-05-26 NOTE — Assessment & Plan Note (Signed)
Start Cialis

## 2011-05-26 NOTE — Telephone Encounter (Signed)
Pt called and wanted MD to be aware that when he was at home after appointment today, he did noticed some blood in his urine (small amount). Pt also states that he had immunizations for Flu and Zostavax today.

## 2011-05-26 NOTE — Assessment & Plan Note (Signed)
augmentin flonase

## 2011-05-26 NOTE — Progress Notes (Signed)
  Subjective:    Patient ID: Eric Griffith, male    DOB: [birth year], 56 y.o.   MRN: [id number]  HPI  The patient is here for a wellness exam. The patient has been doing well overall. He has some complaints, however. C/o ED C/o protatism C/o sinus congestion x 2 mo C/o rash   Review of Systems  Constitutional: Negative for appetite change, fatigue and unexpected weight change.  HENT: Positive for rhinorrhea, postnasal drip and sinus pressure. Negative for nosebleeds, congestion, sore throat, sneezing, trouble swallowing and neck pain.   Eyes: Negative for itching and visual disturbance.  Respiratory: Negative for cough.   Cardiovascular: Negative for chest pain, palpitations and leg swelling.  Gastrointestinal: Negative for nausea, diarrhea, blood in stool and abdominal distention.  Genitourinary: Negative for frequency and hematuria.  Musculoskeletal: Negative for back pain, joint swelling and gait problem.  Skin: Positive for rash.  Neurological: Negative for dizziness, tremors, speech difficulty and weakness.  Psychiatric/Behavioral: Negative for sleep disturbance, dysphoric mood and agitation. The patient is not nervous/anxious.    Wt Readings from Last 3 Encounters:  05/26/11 192 lb (87.091 kg)  05/12/10 192 lb (87.091 kg)  11/26/09 183 lb 12 oz (83.348 kg)   BP Readings from Last 3 Encounters:  05/26/11 140/90  05/12/10 138/90  11/26/09 130/78        Objective:   Physical Exam  Constitutional: He is oriented to person, place, and time. He appears well-developed and well-nourished. No distress.  HENT:  Head: Normocephalic and atraumatic.  Right Ear: External ear normal.  Left Ear: External ear normal.  Nose: Nose normal.  Mouth/Throat: Oropharynx is clear and moist. No oropharyngeal exudate.  Eyes: Conjunctivae and EOM are normal. Pupils are equal, round, and reactive to light. Right eye exhibits no discharge. Left eye exhibits no discharge. No scleral  icterus.  Neck: Normal range of motion. Neck supple. No JVD present. No tracheal deviation present. No thyromegaly present.  Cardiovascular: Normal rate, regular rhythm, normal heart sounds and intact distal pulses.  Exam reveals no gallop and no friction rub.   No murmur heard. Pulmonary/Chest: Effort normal and breath sounds normal. No stridor. No respiratory distress. He has no wheezes. He has no rales. He exhibits no tenderness.  Abdominal: Soft. Bowel sounds are normal. He exhibits no distension and no mass. There is no tenderness. There is no rebound and no guarding.  Genitourinary: Rectum normal, prostate normal and penis normal. Guaiac negative stool. No penile tenderness.  Musculoskeletal: Normal range of motion. He exhibits no edema and no tenderness.  Lymphadenopathy:    He has no cervical adenopathy.  Neurological: He is alert and oriented to person, place, and time. He has normal reflexes. No cranial nerve deficit. He exhibits normal muscle tone. Coordination normal.  Skin: Skin is warm and dry. Rash (extensive intertrigo between buttocks; psoriasis) noted. He is not diaphoretic. No erythema. No pallor.  Psychiatric: He has a normal mood and affect. His behavior is normal. Judgment and thought content normal.   BP Readings from Last 3 Encounters:  05/26/11 140/90  05/12/10 138/90  11/26/09 130/78          Assessment & Plan:

## 2011-05-26 NOTE — Assessment & Plan Note (Signed)
Check BP at home

## 2011-05-26 NOTE — Assessment & Plan Note (Signed)
We discussed age appropriate health related issues, including available/recomended screening tests and vaccinations. We discussed a need for adhering to healthy diet and exercise. Labs/EKG were reviewed/ordered. All questions were answered.  Vaccines were discussed

## 2011-05-26 NOTE — Telephone Encounter (Signed)
Noted. UA was nl Thx

## 2011-05-26 NOTE — Patient Instructions (Signed)
Low salt diet Normal BP,130/85  BP Readings from Last 3 Encounters:  05/26/11 140/90  05/12/10 138/90  11/26/09 130/78

## 2011-05-27 NOTE — Telephone Encounter (Signed)
Left message for pt to callback office.  

## 2011-05-27 NOTE — Telephone Encounter (Signed)
Pt informed of UA results

## 2011-06-28 ENCOUNTER — Other Ambulatory Visit: Payer: Self-pay | Admitting: Internal Medicine

## 2011-12-18 ENCOUNTER — Other Ambulatory Visit: Payer: Self-pay | Admitting: Internal Medicine

## 2012-01-25 ENCOUNTER — Encounter: Payer: Self-pay | Admitting: Internal Medicine

## 2012-01-25 ENCOUNTER — Other Ambulatory Visit (INDEPENDENT_AMBULATORY_CARE_PROVIDER_SITE_OTHER): Payer: Federal, State, Local not specified - PPO

## 2012-01-25 ENCOUNTER — Ambulatory Visit (INDEPENDENT_AMBULATORY_CARE_PROVIDER_SITE_OTHER): Payer: Federal, State, Local not specified - PPO | Admitting: Internal Medicine

## 2012-01-25 VITALS — BP 142/78 | HR 76 | Temp 98.3°F | Resp 16 | Wt 177.0 lb

## 2012-01-25 DIAGNOSIS — E039 Hypothyroidism, unspecified: Secondary | ICD-10-CM

## 2012-01-25 DIAGNOSIS — E785 Hyperlipidemia, unspecified: Secondary | ICD-10-CM

## 2012-01-25 DIAGNOSIS — R030 Elevated blood-pressure reading, without diagnosis of hypertension: Secondary | ICD-10-CM

## 2012-01-25 DIAGNOSIS — M25519 Pain in unspecified shoulder: Secondary | ICD-10-CM

## 2012-01-25 DIAGNOSIS — M25511 Pain in right shoulder: Secondary | ICD-10-CM | POA: Insufficient documentation

## 2012-01-25 LAB — HEPATIC FUNCTION PANEL
AST: 24 U/L (ref 0–37)
Alkaline Phosphatase: 72 U/L (ref 39–117)
Bilirubin, Direct: 0.2 mg/dL (ref 0.0–0.3)
Total Protein: 7.4 g/dL (ref 6.0–8.3)

## 2012-01-25 LAB — BASIC METABOLIC PANEL
GFR: 127.27 mL/min (ref >60.00)
Potassium: 4 mEq/L (ref 3.5–5.1)
Sodium: 133 mEq/L — ABNORMAL LOW (ref 135–145)

## 2012-01-25 LAB — LIPID PANEL
HDL: 53.4 mg/dL (ref >39.00)
VLDL: 12.4 mg/dL (ref 0.0–40.0)

## 2012-01-25 LAB — LDL CHOLESTEROL, DIRECT: Direct LDL: 133.6 mg/dL

## 2012-01-25 MED ORDER — MELOXICAM 15 MG PO TABS: 15.0000 mg | ORAL_TABLET | Freq: Every day | ORAL | Status: DC

## 2012-01-25 NOTE — Progress Notes (Signed)
Patient ID: Long Brimage, male   DOB: 04-08-1956, 56 y.o.   MRN: [id number]  Subjective:    Patient ID: Eric Griffith, male    DOB: 02-02-1956, 56 y.o.   MRN: [id number]  Shoulder Pain  The pain is present in the right shoulder. This is a new problem. The current episode started 1 to 4 weeks ago. There has been no history of extremity trauma. The problem occurs intermittently. The quality of the pain is described as sharp. The pain is at a severity of 8/10. The pain is severe. Pertinent negatives include no fever. Family history does not include gout. There is no history of rheumatoid arthritis.       Review of Systems  Constitutional: Negative for fever, appetite change, fatigue and unexpected weight change.  HENT: Positive for rhinorrhea, postnasal drip and sinus pressure. Negative for nosebleeds, congestion, sore throat, sneezing, trouble swallowing and neck pain.   Eyes: Negative for itching and visual disturbance.  Respiratory: Negative for cough.   Cardiovascular: Negative for chest pain, palpitations and leg swelling.  Gastrointestinal: Negative for nausea, diarrhea, blood in stool and abdominal distention.  Genitourinary: Negative for frequency and hematuria.  Musculoskeletal: Negative for back pain, joint swelling and gait problem.  Skin: Positive for rash.  Neurological: Negative for dizziness, tremors, speech difficulty and weakness.  Psychiatric/Behavioral: Negative for disturbed wake/sleep cycle, dysphoric mood and agitation. The patient is not nervous/anxious.    Wt Readings from Last 3 Encounters:  01/25/12 177 lb (80.287 kg)  05/26/11 192 lb (87.091 kg)  05/12/10 192 lb (87.091 kg)   BP Readings from Last 3 Encounters:  01/25/12 142/78  05/26/11 140/90  05/12/10 138/90        Objective:   Physical Exam  Constitutional: He is oriented to person, place, and time. He appears well-developed and well-nourished. No distress.  HENT:  Head:  Normocephalic and atraumatic.  Right Ear: External ear normal.  Left Ear: External ear normal.  Nose: Nose normal.  Mouth/Throat: Oropharynx is clear and moist. No oropharyngeal exudate.  Eyes: Conjunctivae and EOM are normal. Pupils are equal, round, and reactive to light. Right eye exhibits no discharge. Left eye exhibits no discharge. No scleral icterus.  Neck: Normal range of motion. Neck supple. No JVD present. No tracheal deviation present. No thyromegaly present.  Cardiovascular: Normal rate, regular rhythm, normal heart sounds and intact distal pulses.  Exam reveals no gallop and no friction rub.   No murmur heard. Pulmonary/Chest: Effort normal and breath sounds normal. No stridor. No respiratory distress. He has no wheezes. He has no rales. He exhibits no tenderness.  Abdominal: Soft. Bowel sounds are normal. He exhibits no distension and no mass. There is no tenderness. There is no rebound and no guarding.  Genitourinary: Rectum normal, prostate normal and penis normal. Guaiac negative stool. No penile tenderness.  Musculoskeletal: Normal range of motion. He exhibits no edema and no tenderness.  Lymphadenopathy:    He has no cervical adenopathy.  Neurological: He is alert and oriented to person, place, and time. He has normal reflexes. No cranial nerve deficit. He exhibits normal muscle tone. Coordination normal.  Skin: Skin is warm and dry. Rash (extensive intertrigo between buttocks; psoriasis) noted. He is not diaphoretic. No erythema. No pallor.  Psychiatric: He has a normal mood and affect. His behavior is normal. Judgment and thought content normal.   BP Readings from Last 3 Encounters:  01/25/12 142/78  05/26/11 140/90  05/12/10 138/90  Assessment & Plan:

## 2012-01-25 NOTE — Patient Instructions (Signed)
BP Readings from Last 3 Encounters:  01/25/12 142/78  05/26/11 140/90  05/12/10 138/90   Wt Readings from Last 3 Encounters:  01/25/12 177 lb (80.287 kg)  05/26/11 192 lb (87.091 kg)  05/12/10 192 lb (87.091 kg)   Rotator cuff execises

## 2012-01-25 NOTE — Assessment & Plan Note (Signed)
Continue with current prescription therapy as reflected on the Med list.  

## 2012-01-25 NOTE — Assessment & Plan Note (Signed)
He lost wt Re-check labs

## 2012-01-25 NOTE — Assessment & Plan Note (Signed)
Start NSAIDs Declined a steroid inj

## 2012-01-26 ENCOUNTER — Telehealth: Payer: Self-pay | Admitting: Internal Medicine

## 2012-01-26 DIAGNOSIS — E039 Hypothyroidism, unspecified: Secondary | ICD-10-CM

## 2012-01-26 MED ORDER — LEVOTHYROXINE SODIUM 125 MCG PO TABS: 125.0000 ug | ORAL_TABLET | Freq: Every day | ORAL | Status: DC

## 2012-01-26 NOTE — Telephone Encounter (Signed)
Eric Griffith, please, inform patient that all labs are normal except for abn TSH: reduce Levothyroxine to 125 mcg a day Chol is beter TSH in 6 wks  Please, mail the labs to the patient.    Thx

## 2012-01-26 NOTE — Telephone Encounter (Signed)
Left mess for patient to call back.  

## 2012-01-27 NOTE — Telephone Encounter (Signed)
Pt informed/ copies mailed.  

## 2012-02-03 ENCOUNTER — Encounter: Payer: Self-pay | Admitting: Internal Medicine

## 2012-04-30 ENCOUNTER — Other Ambulatory Visit: Payer: Self-pay | Admitting: Internal Medicine

## 2012-11-27 ENCOUNTER — Encounter: Payer: Self-pay | Admitting: Internal Medicine

## 2012-11-27 ENCOUNTER — Ambulatory Visit (INDEPENDENT_AMBULATORY_CARE_PROVIDER_SITE_OTHER): Payer: Federal, State, Local not specified - PPO | Admitting: Internal Medicine

## 2012-11-27 VITALS — BP 142/80 | HR 88 | Temp 100.4°F | Ht 66.0 in | Wt 185.0 lb

## 2012-11-27 DIAGNOSIS — R6889 Other general symptoms and signs: Secondary | ICD-10-CM

## 2012-11-27 DIAGNOSIS — B9789 Other viral agents as the cause of diseases classified elsewhere: Secondary | ICD-10-CM

## 2012-11-27 DIAGNOSIS — B349 Viral infection, unspecified: Secondary | ICD-10-CM

## 2012-11-27 NOTE — Patient Instructions (Signed)
Viral Infections °A virus is a type of germ. Viruses can cause: °· Minor sore throats. °· Aches and pains. °· Headaches. °· Runny nose. °· Rashes. °· Watery eyes. °· Tiredness. °· Coughs. °· Loss of appetite. °· Feeling sick to your stomach (nausea). °· Throwing up (vomiting). °· Watery poop (diarrhea). °HOME CARE  °· Only take medicines as told by your doctor. °· Drink enough water and fluids to keep your pee (urine) clear or pale yellow. Sports drinks are a good choice. °· Get plenty of rest and eat healthy. Soups and broths with crackers or rice are fine. °GET HELP RIGHT AWAY IF:  °· You have a very bad headache. °· You have shortness of breath. °· You have chest pain or neck pain. °· You have an unusual rash. °· You cannot stop throwing up. °· You have watery poop that does not stop. °· You cannot keep fluids down. °· You or your child has a temperature by mouth above 102° F (38.9° C), not controlled by medicine. °· Your baby is older than 3 months with a rectal temperature of 102° F (38.9° C) or higher. °· Your baby is 3 months old or younger with a rectal temperature of 100.4° F (38° C) or higher. °MAKE SURE YOU:  °· Understand these instructions. °· Will watch this condition. °· Will get help right away if you are not doing well or get worse. °Document Released: 04/21/2008 Document Revised: 08/01/2011 Document Reviewed: 09/14/2010 °[employer]® Patient Information ©2014 [employer]. ° °

## 2012-11-27 NOTE — Progress Notes (Signed)
HPI  Pt presents to the clinic today with c/o flu like symptoms (headache, fever, chills, body aches) x 1 days. He came back from the beach yesterday and just felt terrible. He noticed he was running a fever. He took some tylenol and got in the bed and tried to sleep it off. He woke up not feeling any better. He denies cough, nausea, vomiting or diarrhea. He has not had sick contacts that he is aware of.  Review of Systems      Past Medical History  Diagnosis Date  . HEPATITIS C 02/12/2007  . HYPOTHYROIDISM 04/23/2007  . PSORIASIS 02/12/2007  . PSORIATIC ARTHRITIS 04/23/2007  . ERECTILE DYSFUNCTION 02/12/2007  . BUNDLE BRANCH BLOCK, RIGHT 08/15/2007    present for long time  . INSOMNIA, PERSISTENT 06/12/2008  . Hyperlipidemia     Family History  Problem Relation Age of Onset  . Cancer Mother     Pancreatic cancer  . Cancer Father     Lung Cancer    History   Social History  . Marital Status: Married    Spouse Name: N/A    Number of Children: N/A  . Years of Education: N/A   Occupational History  . Pilot    Social History Main Topics  . Smoking status: Former Games developer  . Smokeless tobacco: Not on file  . Alcohol Use: No  . Drug Use: Not on file  . Sexually Active: Not on file   Other Topics Concern  . Not on file   Social History Narrative  . No narrative on file    Allergies  Allergen Reactions  . Lovastatin     REACTION: aches     Constitutional: Positive headache, fatigue and fever. Denies abrupt weight changes.  HEENT:  Denies eye redness, eye pain, pressure behind the eyes, facial pain, nasal congestion, ear pain, ringing in the ears, wax buildup, runny nose or bloody nose. Respiratory: Denies cough, difficulty breathing or shortness of breath.  Cardiovascular: Denies chest pain, chest tightness, palpitations or swelling in the hands or feet.   No other specific complaints in a complete review of systems (except as listed in HPI above).  Objective:    BP 142/80  Pulse 88  Temp(Src) 100.4 F (38 C) (Oral)  Ht 5\' 6"  (1.676 m)  Wt 185 lb (83.915 kg)  BMI 29.87 kg/m2  SpO2 96% Wt Readings from Last 3 Encounters:  11/27/12 185 lb (83.915 kg)  01/25/12 177 lb (80.287 kg)  05/26/11 192 lb (87.091 kg)     General: Appears his stated age, well developed, well nourished in NAD. HEENT: Head: normal shape and size; Eyes: sclera white, no icterus, conjunctiva pink, PERRLA and EOMs intact; Ears: Tm's gray and intact, normal light reflex; Nose: mucosa pink and moist, septum midline; Throat/Mouth: + PND. Teeth present, mucosa erythematous and moist, no exudate noted, no lesions or ulcerations noted.  Neck:. Neck supple, trachea midline. No massses, lumps or thyromegaly present.  Cardiovascular: Normal rate and rhythm. S1,S2 noted.  No murmur, rubs or gallops noted. No JVD or BLE edema. No carotid bruits noted. Pulmonary/Chest: Normal effort and positive vesicular breath sounds. No respiratory distress. No wheezes, rales or ronchi noted.      Assessment & Plan:   Viral illness, new onset:  Get some rest and drink plenty of water Tylenol as needed for pain and fever Reassurance given that this will resolve in 5-7 days  RTC as needed or if symptoms persist.

## 2012-12-20 ENCOUNTER — Encounter: Payer: Self-pay | Admitting: Internal Medicine

## 2012-12-20 ENCOUNTER — Ambulatory Visit (INDEPENDENT_AMBULATORY_CARE_PROVIDER_SITE_OTHER): Payer: Federal, State, Local not specified - PPO | Admitting: Internal Medicine

## 2012-12-20 VITALS — BP 140/80 | HR 72 | Temp 97.5°F | Resp 16 | Wt 179.0 lb

## 2012-12-20 DIAGNOSIS — M25519 Pain in unspecified shoulder: Secondary | ICD-10-CM

## 2012-12-20 DIAGNOSIS — M25511 Pain in right shoulder: Secondary | ICD-10-CM

## 2012-12-20 DIAGNOSIS — J019 Acute sinusitis, unspecified: Secondary | ICD-10-CM | POA: Insufficient documentation

## 2012-12-20 DIAGNOSIS — L408 Other psoriasis: Secondary | ICD-10-CM

## 2012-12-20 DIAGNOSIS — R030 Elevated blood-pressure reading, without diagnosis of hypertension: Secondary | ICD-10-CM

## 2012-12-20 DIAGNOSIS — E039 Hypothyroidism, unspecified: Secondary | ICD-10-CM

## 2012-12-20 MED ORDER — AZITHROMYCIN 250 MG PO TABS: ORAL_TABLET | ORAL | Status: DC

## 2012-12-20 MED ORDER — PSEUDOEPHEDRINE HCL ER 120 MG PO TB12: 120.0000 mg | ORAL_TABLET | Freq: Two times a day (BID) | ORAL | Status: DC | PRN

## 2012-12-20 NOTE — Progress Notes (Signed)
  Subjective:   Sinusitis Associated symptoms include sinus pressure. Pertinent negatives include no congestion, coughing, neck pain, sneezing or sore throat.       Review of Systems  Constitutional: Negative for appetite change, fatigue and unexpected weight change.  HENT: Positive for rhinorrhea, postnasal drip and sinus pressure. Negative for nosebleeds, congestion, sore throat, sneezing, trouble swallowing and neck pain.   Eyes: Negative for itching and visual disturbance.  Respiratory: Negative for cough.   Cardiovascular: Negative for chest pain, palpitations and leg swelling.  Gastrointestinal: Negative for nausea, diarrhea, blood in stool and abdominal distention.  Genitourinary: Negative for frequency and hematuria.  Musculoskeletal: Negative for back pain, joint swelling and gait problem.  Skin: Positive for rash.  Neurological: Negative for dizziness, tremors, speech difficulty and weakness.  Psychiatric/Behavioral: Negative for sleep disturbance, dysphoric mood and agitation. The patient is not nervous/anxious.    Wt Readings from Last 3 Encounters:  12/20/12 179 lb (81.194 kg)  11/27/12 185 lb (83.915 kg)  01/25/12 177 lb (80.287 kg)   BP Readings from Last 3 Encounters:  12/20/12 140/80  11/27/12 142/80  01/25/12 142/78        Objective:   Physical Exam  Constitutional: He is oriented to person, place, and time. He appears well-developed and well-nourished. No distress.  HENT:  Head: Normocephalic and atraumatic.  Right Ear: External ear normal.  Left Ear: External ear normal.  Nose: Nose normal.  Mouth/Throat: Oropharynx is clear and moist. No oropharyngeal exudate.  Eyes: Conjunctivae and EOM are normal. Pupils are equal, round, and reactive to light. Right eye exhibits no discharge. Left eye exhibits no discharge. No scleral icterus.  Neck: Normal range of motion. Neck supple. No JVD present. No tracheal deviation present. No thyromegaly present.   Cardiovascular: Normal rate, regular rhythm, normal heart sounds and intact distal pulses.  Exam reveals no gallop and no friction rub.   No murmur heard. Pulmonary/Chest: Effort normal and breath sounds normal. No stridor. No respiratory distress. He has no wheezes. He has no rales. He exhibits no tenderness.  Abdominal: Soft. Bowel sounds are normal. He exhibits no distension and no mass. There is no tenderness. There is no rebound and no guarding.  Genitourinary: Rectum normal, prostate normal and penis normal. Guaiac negative stool. No penile tenderness.  Musculoskeletal: Normal range of motion. He exhibits no edema and no tenderness.  Lymphadenopathy:    He has no cervical adenopathy.  Neurological: He is alert and oriented to person, place, and time. He has normal reflexes. No cranial nerve deficit. He exhibits normal muscle tone. Coordination normal.  Skin: Skin is warm and dry. Rash (extensive intertrigo between buttocks; psoriasis) noted. He is not diaphoretic. No erythema. No pallor.  Psychiatric: He has a normal mood and affect. His behavior is normal. Judgment and thought content normal.   BP Readings from Last 3 Encounters:  12/20/12 140/80  11/27/12 142/80  01/25/12 142/78          Assessment & Plan:

## 2012-12-20 NOTE — Assessment & Plan Note (Signed)
Zpac Sudafed

## 2012-12-20 NOTE — Assessment & Plan Note (Signed)
Better  

## 2012-12-20 NOTE — Assessment & Plan Note (Signed)
Gluten free trial (no wheat products) x4-6 weeks. OK to use Gluten-free bread and pasta. Milk free trial (no milk, ice cream and yogurt) x4 weeks. OK to use almond or soy milk. 

## 2012-12-20 NOTE — Assessment & Plan Note (Addendum)
NAS diet Med suggested Will re-check BP Readings from Last 3 Encounters:  12/20/12 140/80  11/27/12 142/80  01/25/12 142/78

## 2012-12-20 NOTE — Patient Instructions (Addendum)
Use over-the-counter  "cold" medicines  such as "Afrin" nasal spray for nasal congestion as directed instead. Use" Delsym" or" Robitussin" cough syrup varietis for cough.  You can use plain "Tylenol" or "Advil" for fever, chills and achyness.   "Common cold" symptoms are usually triggered by a virus.  The antibiotics are usually not necessary. On average, a" viral cold" illness would take 4-7 days to resolve. Please, make an appointment if you are not better or if you're worse.  BP Readings from Last 3 Encounters:  12/20/12 140/80  11/27/12 142/80  01/25/12 142/78

## 2012-12-20 NOTE — Assessment & Plan Note (Signed)
Continue with current prescription therapy as reflected on the Med list.  

## 2013-01-11 ENCOUNTER — Other Ambulatory Visit: Payer: Self-pay | Admitting: Internal Medicine

## 2013-10-09 ENCOUNTER — Encounter: Payer: Self-pay | Admitting: Internal Medicine

## 2013-10-09 ENCOUNTER — Ambulatory Visit (INDEPENDENT_AMBULATORY_CARE_PROVIDER_SITE_OTHER): Payer: Federal, State, Local not specified - PPO | Admitting: Internal Medicine

## 2013-10-09 ENCOUNTER — Other Ambulatory Visit (INDEPENDENT_AMBULATORY_CARE_PROVIDER_SITE_OTHER): Payer: Federal, State, Local not specified - PPO

## 2013-10-09 VITALS — BP 138/90 | HR 76 | Temp 97.9°F | Resp 16 | Ht 66.0 in | Wt 180.0 lb

## 2013-10-09 DIAGNOSIS — L408 Other psoriasis: Secondary | ICD-10-CM

## 2013-10-09 DIAGNOSIS — M79641 Pain in right hand: Secondary | ICD-10-CM

## 2013-10-09 DIAGNOSIS — B1710 Acute hepatitis C without hepatic coma: Secondary | ICD-10-CM

## 2013-10-09 DIAGNOSIS — E039 Hypothyroidism, unspecified: Secondary | ICD-10-CM

## 2013-10-09 DIAGNOSIS — Z23 Encounter for immunization: Secondary | ICD-10-CM

## 2013-10-09 DIAGNOSIS — Z0000 Encounter for general adult medical examination without abnormal findings: Secondary | ICD-10-CM

## 2013-10-09 DIAGNOSIS — L4050 Arthropathic psoriasis, unspecified: Secondary | ICD-10-CM

## 2013-10-09 LAB — LIPID PANEL
CHOL/HDL RATIO: 4
Cholesterol: 194 mg/dL (ref 0–200)
HDL: 45.7 mg/dL (ref >39.00)
LDL Cholesterol: 137 mg/dL — ABNORMAL HIGH (ref 0–99)
Triglycerides: 56 mg/dL (ref 0.0–149.0)
VLDL: 11.2 mg/dL (ref 0.0–40.0)

## 2013-10-09 LAB — URINALYSIS
Hgb urine dipstick: NEGATIVE
KETONES UR: 15 — AB
Leukocytes, UA: NEGATIVE
Nitrite: NEGATIVE
Specific Gravity, Urine: 1.025 (ref 1.000–1.030)
Total Protein, Urine: NEGATIVE
URINE GLUCOSE: NEGATIVE
UROBILINOGEN UA: 0.2 (ref 0.0–1.0)
pH: 5.5 (ref 5.0–8.0)

## 2013-10-09 LAB — BASIC METABOLIC PANEL
BUN: 10 mg/dL (ref 6–23)
CHLORIDE: 101 meq/L (ref 96–112)
CO2: 26 meq/L (ref 19–32)
Calcium: 9.7 mg/dL (ref 8.4–10.5)
Creatinine, Ser: 0.8 mg/dL (ref 0.4–1.5)
GFR: 113 mL/min (ref >60.00)
GLUCOSE: 88 mg/dL (ref 70–99)
POTASSIUM: 3.9 meq/L (ref 3.5–5.1)
SODIUM: 136 meq/L (ref 135–145)

## 2013-10-09 LAB — CBC WITH DIFFERENTIAL/PLATELET
BASOS ABS: 0 10*3/uL (ref 0.0–0.1)
Basophils Relative: 0.5 % (ref 0.0–3.0)
EOS PCT: 4.3 % (ref 0.0–5.0)
Eosinophils Absolute: 0.3 10*3/uL (ref 0.0–0.7)
HEMATOCRIT: 48 % (ref 39.0–52.0)
HEMOGLOBIN: 16.3 g/dL (ref 13.0–17.0)
Lymphocytes Relative: 25.3 % (ref 12.0–46.0)
Lymphs Abs: 2 10*3/uL (ref 0.7–4.0)
MCHC: 34 g/dL (ref 30.0–36.0)
MCV: 87.9 fl (ref 78.0–100.0)
MONO ABS: 0.7 10*3/uL (ref 0.1–1.0)
Monocytes Relative: 9.2 % (ref 3.0–12.0)
Neutro Abs: 4.9 10*3/uL (ref 1.4–7.7)
Neutrophils Relative %: 60.7 % (ref 43.0–77.0)
Platelets: 380 10*3/uL (ref 150.0–400.0)
RBC: 5.46 Mil/uL (ref 4.22–5.81)
RDW: 13.9 % (ref 11.5–15.5)
WBC: 8.1 10*3/uL (ref 4.0–10.5)

## 2013-10-09 LAB — T4, FREE: Free T4: 1.54 ng/dL (ref 0.60–1.60)

## 2013-10-09 LAB — HEPATIC FUNCTION PANEL
ALT: 32 U/L (ref 0–53)
AST: 28 U/L (ref 0–37)
Albumin: 4.5 g/dL (ref 3.5–5.2)
Alkaline Phosphatase: 71 U/L (ref 39–117)
Bilirubin, Direct: 0.2 mg/dL (ref 0.0–0.3)
Total Bilirubin: 0.9 mg/dL (ref 0.2–1.2)
Total Protein: 7.4 g/dL (ref 6.0–8.3)

## 2013-10-09 LAB — SEDIMENTATION RATE: Sed Rate: 5 mm/hr (ref 0–22)

## 2013-10-09 LAB — TSH: TSH: 0.08 u[IU]/mL — AB (ref 0.35–4.50)

## 2013-10-09 LAB — PSA: PSA: 1.96 ng/mL (ref 0.10–4.00)

## 2013-10-09 MED ORDER — MELOXICAM 15 MG PO TABS: ORAL_TABLET | ORAL | Status: DC

## 2013-10-09 NOTE — Assessment & Plan Note (Signed)
R hand     5/15

## 2013-10-09 NOTE — Progress Notes (Signed)
Pre visit review using our clinic review tool, if applicable. No additional management support is needed unless otherwise documented below in the visit note. 

## 2013-10-09 NOTE — Assessment & Plan Note (Addendum)
We discussed age appropriate health related issues, including available/recomended screening tests and vaccinations. We discussed a need for adhering to healthy diet and exercise. Labs/EKG were reviewed/ordered. All questions were answered.   

## 2013-10-09 NOTE — Assessment & Plan Note (Signed)
Continue with current prescription therapy as reflected on the Med list.  

## 2013-10-09 NOTE — Progress Notes (Signed)
Subjective:   HPI  The patient is here for a wellness exam. The patient has been doing well overall without major physical or psychological issues going on lately. He has been gluten free   Review of Systems  Constitutional: Negative for appetite change, fatigue and unexpected weight change.  HENT: Positive for postnasal drip and rhinorrhea. Negative for nosebleeds and trouble swallowing.   Eyes: Negative for itching and visual disturbance.  Cardiovascular: Negative for chest pain, palpitations and leg swelling.  Gastrointestinal: Negative for nausea, diarrhea, blood in stool and abdominal distention.  Genitourinary: Negative for frequency and hematuria.  Musculoskeletal: Negative for back pain, gait problem and joint swelling.  Skin: Positive for rash.  Neurological: Negative for dizziness, tremors, speech difficulty and weakness.  Psychiatric/Behavioral: Negative for sleep disturbance, dysphoric mood and agitation. The patient is not nervous/anxious.    Wt Readings from Last 3 Encounters:  10/09/13 180 lb (81.647 kg)  12/20/12 179 lb (81.194 kg)  11/27/12 185 lb (83.915 kg)   BP Readings from Last 3 Encounters:  10/09/13 138/90  12/20/12 140/80  11/27/12 142/80        Objective:   Physical Exam  Constitutional: He is oriented to person, place, and time. He appears well-developed and well-nourished. No distress.  HENT:  Head: Normocephalic and atraumatic.  Right Ear: External ear normal.  Left Ear: External ear normal.  Nose: Nose normal.  Mouth/Throat: Oropharynx is clear and moist. No oropharyngeal exudate.  Eyes: Conjunctivae and EOM are normal. Pupils are equal, round, and reactive to light. Right eye exhibits no discharge. Left eye exhibits no discharge. No scleral icterus.  Neck: Normal range of motion. Neck supple. No JVD present. No tracheal deviation present. No thyromegaly present.  Cardiovascular: Normal rate, regular rhythm, normal heart sounds and intact  distal pulses.  Exam reveals no gallop and no friction rub.   No murmur heard. Pulmonary/Chest: Effort normal and breath sounds normal. No stridor. No respiratory distress. He has no wheezes. He has no rales. He exhibits no tenderness.  Abdominal: Soft. Bowel sounds are normal. He exhibits no distension and no mass. There is no tenderness. There is no rebound and no guarding.  Genitourinary: Rectum normal, prostate normal and penis normal. Guaiac negative stool. No penile tenderness.  Musculoskeletal: Normal range of motion. He exhibits no edema and no tenderness.  Lymphadenopathy:    He has no cervical adenopathy.  Neurological: He is alert and oriented to person, place, and time. He has normal reflexes. No cranial nerve deficit. He exhibits normal muscle tone. Coordination normal.  Skin: Skin is warm and dry. Rash (extensive intertrigo between buttocks; psoriasis) noted. He is not diaphoretic. No erythema. No pallor.  Psychiatric: He has a normal mood and affect. His behavior is normal. Judgment and thought content normal.   Lab Results  Component Value Date   WBC 9.1 05/26/2011   HGB 16.3 05/26/2011   HCT 48.0 05/26/2011   PLT 295.0 05/26/2011   GLUCOSE 87 01/25/2012   CHOL 202* 01/25/2012   TRIG 62.0 01/25/2012   HDL 53.40 01/25/2012   LDLDIRECT 133.6 01/25/2012   LDLCALC 132* 03/17/2009   ALT 28 01/25/2012   AST 24 01/25/2012   NA 133* 01/25/2012   K 4.0 01/25/2012   CL 99 01/25/2012   CREATININE 0.7 01/25/2012   BUN 13 01/25/2012   CO2 26 01/25/2012   TSH 0.16* 01/25/2012   PSA 1.58 05/26/2011   HGBA1C 5.6 06/19/2006  Assessment & Plan:

## 2013-10-09 NOTE — Patient Instructions (Signed)
   Milk free trial (no milk, ice cream, cheese and yogurt) for 4-6 weeks. OK to use almond, coconut, rice milk. "Almond breeze" brand tastes good.  

## 2013-10-09 NOTE — Assessment & Plan Note (Signed)
Labs

## 2013-10-09 NOTE — Assessment & Plan Note (Addendum)
Labs GI consult -- Hepatitis clinic

## 2013-10-10 LAB — HEPATITIS C RNA QUANTITATIVE
HCV Quantitative Log: 6.23 {Log} — ABNORMAL HIGH (ref <1.18)
HCV Quantitative: 1686009 IU/mL — ABNORMAL HIGH (ref <15)

## 2013-10-10 LAB — HEPATITIS C ANTIBODY: HCV Ab: REACTIVE — AB

## 2013-10-15 ENCOUNTER — Encounter: Payer: Self-pay | Admitting: Internal Medicine

## 2013-10-15 ENCOUNTER — Other Ambulatory Visit: Payer: Self-pay | Admitting: Internal Medicine

## 2013-10-15 DIAGNOSIS — B182 Chronic viral hepatitis C: Secondary | ICD-10-CM

## 2013-10-23 ENCOUNTER — Ambulatory Visit (INDEPENDENT_AMBULATORY_CARE_PROVIDER_SITE_OTHER): Payer: Federal, State, Local not specified - PPO | Admitting: Internal Medicine

## 2013-10-23 ENCOUNTER — Encounter: Payer: Self-pay | Admitting: Internal Medicine

## 2013-10-23 VITALS — BP 136/76 | HR 80 | Temp 98.1°F | Wt 184.8 lb

## 2013-10-23 DIAGNOSIS — B182 Chronic viral hepatitis C: Secondary | ICD-10-CM

## 2013-10-23 DIAGNOSIS — Z1211 Encounter for screening for malignant neoplasm of colon: Secondary | ICD-10-CM | POA: Insufficient documentation

## 2013-10-23 DIAGNOSIS — B1710 Acute hepatitis C without hepatic coma: Secondary | ICD-10-CM

## 2013-10-23 NOTE — Assessment & Plan Note (Signed)
Cologuard

## 2013-10-23 NOTE — Progress Notes (Signed)
Pre visit review using our clinic review tool, if applicable. No additional management support is needed unless otherwise documented below in the visit note. 

## 2013-10-27 NOTE — Assessment & Plan Note (Signed)
Labs discussed Ref to Hep C Clinic w/[facility]

## 2013-11-01 ENCOUNTER — Ambulatory Visit
Admission: RE | Admit: 2013-11-01 | Discharge: 2013-11-01 | Disposition: A | Discharge status: Home / Self Care | Payer: Federal, State, Local not specified - PPO | Source: Ambulatory Visit | Attending: Internal Medicine | Admitting: Internal Medicine

## 2013-11-14 ENCOUNTER — Telehealth: Payer: Self-pay | Admitting: *Deleted

## 2013-11-14 NOTE — Telephone Encounter (Signed)
It is normal - good. Thx

## 2013-11-14 NOTE — Telephone Encounter (Signed)
Left msg on triage stating he never received results on the abd. U/s that was done 2 weeks ago. Pls advise...Raechel Chute/lmb

## 2013-11-14 NOTE — Telephone Encounter (Signed)
Notified pt with md response.../lmb 

## 2013-11-19 LAB — COLOGUARD: Cologuard: NEGATIVE

## 2013-12-05 ENCOUNTER — Telehealth: Payer: Self-pay | Admitting: *Deleted

## 2013-12-05 DIAGNOSIS — M79641 Pain in right hand: Secondary | ICD-10-CM

## 2013-12-05 NOTE — Telephone Encounter (Signed)
Pt informed of negative Cologuard colon cancer screening.   He is requesting Right hand x ray. Please advise.

## 2013-12-06 NOTE — Telephone Encounter (Signed)
Left detailed mess informing pt.  

## 2013-12-06 NOTE — Telephone Encounter (Signed)
Ok done Thx 

## 2013-12-11 ENCOUNTER — Encounter: Payer: Self-pay | Admitting: Internal Medicine

## 2014-02-11 ENCOUNTER — Ambulatory Visit (INDEPENDENT_AMBULATORY_CARE_PROVIDER_SITE_OTHER): Payer: Federal, State, Local not specified - PPO | Admitting: Nurse Practitioner

## 2014-02-11 ENCOUNTER — Encounter: Payer: Self-pay | Admitting: Nurse Practitioner

## 2014-02-11 ENCOUNTER — Ambulatory Visit (INDEPENDENT_AMBULATORY_CARE_PROVIDER_SITE_OTHER)
Admission: RE | Admit: 2014-02-11 | Discharge: 2014-02-11 | Disposition: A | Discharge status: Home / Self Care | Payer: Federal, State, Local not specified - PPO | Source: Ambulatory Visit | Attending: Nurse Practitioner | Admitting: Nurse Practitioner

## 2014-02-11 VITALS — BP 140/80 | HR 66 | Temp 98.1°F | Wt 181.0 lb

## 2014-02-11 DIAGNOSIS — M79609 Pain in unspecified limb: Secondary | ICD-10-CM

## 2014-02-11 DIAGNOSIS — M79675 Pain in left toe(s): Secondary | ICD-10-CM

## 2014-02-11 DIAGNOSIS — Z23 Encounter for immunization: Secondary | ICD-10-CM

## 2014-02-11 DIAGNOSIS — M79672 Pain in left foot: Secondary | ICD-10-CM

## 2014-02-11 NOTE — Progress Notes (Signed)
Pre visit review using our clinic review tool, if applicable. No additional management support is needed unless otherwise documented below in the visit note. 

## 2014-02-11 NOTE — Patient Instructions (Signed)
Wear supportive shoes Avoid prolonged weight bearing of foot.  Call clinic if not better in 3 weeks for appointment with Dr. Katrinka Blazing

## 2014-02-11 NOTE — Progress Notes (Signed)
Subjective:    Patient ID: Eric Griffith, male    DOB: 21-Nov-1955, 58 y.o.   MRN: [id number]  HPIPpatient presents with swollen third toe of left foot. He states toe isn't that painful but bottom of foot Near the toe is most painful with weight bearing and is worse in the AM.  Patient denies trauma to the affected area.  He walks 2-4 miles several weekly.  He has not changed walking shoes recently.     Review of Systems  Constitutional: Negative.  Negative for activity change.  Skin: Negative for rash and wound.       Denies trauma to affected foot or toe.   Neurological: Negative.  Negative for weakness and numbness.   Past Medical History  Diagnosis Date  . HEPATITIS C 02/12/2007  . HYPOTHYROIDISM 04/23/2007  . PSORIASIS 02/12/2007  . PSORIATIC ARTHRITIS 04/23/2007  . ERECTILE DYSFUNCTION 02/12/2007  . BUNDLE BRANCH BLOCK, RIGHT 08/15/2007    present for long time  . INSOMNIA, PERSISTENT 06/12/2008  . Hyperlipidemia     History   Social History  . Marital Status: Married    Spouse Name: N/A    Number of Children: N/A  . Years of Education: N/A   Occupational History  . Pilot    Social History Main Topics  . Smoking status: Former Games developer  . Smokeless tobacco: Not on file  . Alcohol Use: No  . Drug Use: No  . Sexual Activity: Yes   Other Topics Concern  . Not on file   Social History Narrative  . No narrative on file    No past surgical history on file.  Family History  Problem Relation Age of Onset  . Cancer Mother     Pancreatic cancer  . Cancer Father     Lung Cancer    Allergies  Allergen Reactions  . Lovastatin     REACTION: aches    Current Outpatient Prescriptions on File Prior to Visit  Medication Sig Dispense Refill  . clobetasol cream (TEMOVATE) 0.05 % Apply topically 2 (two) times daily.        . fish oil-omega-3 fatty acids 1000 MG capsule Take 2 g by mouth 2 (two) times daily.        [name] levothyroxine (SYNTHROID, LEVOTHROID) 125  MCG tablet TAKE 1 TABLET BY MOUTH EVERY DAY  90 tablet  2  . meloxicam (MOBIC) 15 MG tablet TAKE 0.5-1 TABLET BY MOUTH EVERY DAY  30 tablet  3   No current facility-administered medications on file prior to visit.    BP 140/80  Pulse 66  Temp(Src) 98.1 F (36.7 C) (Oral)  Wt 181 lb (82.101 kg)  SpO2 96%        Objective:   Physical Exam  Constitutional: He is oriented to person, place, and time. He appears well-developed and well-nourished. No distress.  Musculoskeletal: He exhibits no edema.  Left foot mildly painful with palpation of bottom of foot near proximal third toe. No mass or tumor palpated.  Third left toe inflamed, good ROM. Non tender on palpation  Neurological: He is alert and oriented to person, place, and time.  Skin: Skin is warm and dry.  Skin intact. Third toe nail appears to be traumatized. Discolored white  Psychiatric: He has a normal mood and affect.          Assessment & Plan:  1. Need for prophylactic vaccination and inoculation against influenza   2. Foot pain, left - DG Foot Complete  Left; Future Continue Mobic po bid Possible neuroma/ stress fracture  Refer to Dr. Katrinka Blazing or Podiatry if no better in 3 weeks.    3. Toe pain, left Xray to include third toe.  Patient to call clinic if symptoms worsen or not resolved Keep follow up appointment with Primary Care Physician

## 2014-03-10 ENCOUNTER — Telehealth: Payer: Self-pay | Admitting: Internal Medicine

## 2014-03-10 NOTE — Telephone Encounter (Signed)
Pt left message having foot pain, finger,hand and wrist pain. Pt inquiring if Dr Posey ReaPlotnikov would like him pt to see a specialist or OV? Pls advise.

## 2014-03-10 NOTE — Telephone Encounter (Signed)
OV with PCP or any available MD since PCP is out of office all week this week.

## 2014-03-20 ENCOUNTER — Ambulatory Visit (INDEPENDENT_AMBULATORY_CARE_PROVIDER_SITE_OTHER): Payer: Federal, State, Local not specified - PPO | Admitting: Internal Medicine

## 2014-03-20 ENCOUNTER — Encounter: Payer: Self-pay | Admitting: Internal Medicine

## 2014-03-20 ENCOUNTER — Ambulatory Visit (INDEPENDENT_AMBULATORY_CARE_PROVIDER_SITE_OTHER)
Admission: RE | Admit: 2014-03-20 | Discharge: 2014-03-20 | Disposition: A | Discharge status: Home / Self Care | Payer: Federal, State, Local not specified - PPO | Source: Ambulatory Visit | Attending: Internal Medicine | Admitting: Internal Medicine

## 2014-03-20 VITALS — BP 130/72 | HR 75 | Temp 98.1°F | Resp 12 | Ht 66.0 in | Wt 175.8 lb

## 2014-03-20 DIAGNOSIS — L4050 Arthropathic psoriasis, unspecified: Secondary | ICD-10-CM

## 2014-03-20 MED ORDER — DICLOFENAC SODIUM 75 MG PO TBEC
75.0000 mg | DELAYED_RELEASE_TABLET | Freq: Two times a day (BID) | ORAL | Status: DC
Start: 2014-03-20 — End: 2014-06-22

## 2014-03-20 NOTE — Patient Instructions (Signed)
We will send in voltaren to replace the mobic. You take 1 pill 2 times per day for pain or stiffness in your hands.   We will also check x-rays of your hands to see if there are any changes of arthritis.   If there are changes from arthritis it may be coming from psoriatic arthritis, if there are no changes of arthritis you can try wrist splints at night time to see if this helps.   Come back as needed to follow up about the hands.

## 2014-03-20 NOTE — Progress Notes (Signed)
Pre visit review using our clinic review tool, if applicable. No additional management support is needed unless otherwise documented below in the visit note. 

## 2014-03-21 NOTE — Assessment & Plan Note (Signed)
Feel that there are some changes on physical exam consistent with psoriatic arthritis. Will order complete left and right hand to check for erosions. Switch mobic to voltaren BID for better pain relief and reinforced to continue being active.

## 2014-03-21 NOTE — Progress Notes (Signed)
   Subjective:    Patient ID: Eric Griffith, male    DOB: June 22, 1955, 58 y.o.   MRN: [id number]  HPI The patient is a 58 YO man who is coming in for an acute visit for hand pain. He has psoriasis and the rash is fairly well controlled at the time. He was started on mobic several months ago and this has helped some but he is still having stiffness in the morning and some pain throughout the day. Acitivity makes it feel less stiff. Denies fevers, weight loss, back pain.  Review of Systems  Constitutional: Negative for fever, chills, activity change, appetite change, fatigue and unexpected weight change.  Respiratory: Negative.   Cardiovascular: Negative.   Gastrointestinal: Negative.   Musculoskeletal: Positive for arthralgias. Negative for back pain, gait problem, joint swelling and myalgias.  Skin: Positive for rash.  Neurological: Negative.       Objective:   Physical Exam  Constitutional: He is oriented to person, place, and time. He appears well-developed and well-nourished. No distress.  HENT:  Head: Normocephalic and atraumatic.  Eyes: EOM are normal.  Neck: Normal range of motion.  Cardiovascular: Normal rate and regular rhythm.   Pulmonary/Chest: Effort normal and breath sounds normal. No respiratory distress. He has no wheezes. He has no rales.  Abdominal: Soft. Bowel sounds are normal. He exhibits no distension. There is no tenderness. There is no rebound.  Musculoskeletal: He exhibits no edema and no tenderness.  Hands without redness or swelling of joints, some stigmata of sausage joints.  Neurological: He is alert and oriented to person, place, and time.   Filed Vitals:   03/20/14 0956  BP: 130/72  Pulse: 75  Temp: 98.1 F (36.7 C)  TempSrc: Oral  Resp: 12  Height: 5\' 6"  (1.676 m)  Weight: 175 lb 12.8 oz (79.742 kg)  SpO2: 97%      Assessment & Plan:

## 2014-03-24 ENCOUNTER — Other Ambulatory Visit: Payer: Self-pay | Admitting: Internal Medicine

## 2014-05-30 ENCOUNTER — Ambulatory Visit: Payer: Federal, State, Local not specified - PPO | Admitting: Internal Medicine

## 2014-06-05 ENCOUNTER — Encounter: Payer: Self-pay | Admitting: Internal Medicine

## 2014-06-05 ENCOUNTER — Ambulatory Visit (INDEPENDENT_AMBULATORY_CARE_PROVIDER_SITE_OTHER): Payer: Federal, State, Local not specified - PPO | Admitting: Internal Medicine

## 2014-06-05 VITALS — BP 140/88 | HR 69 | Temp 98.0°F | Wt 183.0 lb

## 2014-06-05 DIAGNOSIS — E034 Atrophy of thyroid (acquired): Secondary | ICD-10-CM

## 2014-06-05 DIAGNOSIS — B182 Chronic viral hepatitis C: Secondary | ICD-10-CM

## 2014-06-05 DIAGNOSIS — E038 Other specified hypothyroidism: Secondary | ICD-10-CM

## 2014-06-05 DIAGNOSIS — L4050 Arthropathic psoriasis, unspecified: Secondary | ICD-10-CM

## 2014-06-05 MED ORDER — VITAMIN D3 50 MCG (2000 UT) PO CAPS: 2000.0000 [IU] | ORAL_CAPSULE | Freq: Every day | ORAL | Status: DC

## 2014-06-05 NOTE — Progress Notes (Signed)
Pre visit review using our clinic review tool, if applicable. No additional management support is needed unless otherwise documented below in the visit note. 

## 2014-06-05 NOTE — Progress Notes (Signed)
Subjective:   HPI  The patient is here for a wellness exam. The patient has been doing well overall without major physical or psychological issues going on lately. He has been gluten free   Review of Systems  Constitutional: Negative for appetite change, fatigue and unexpected weight change.  HENT: Positive for postnasal drip and rhinorrhea. Negative for nosebleeds and trouble swallowing.   Eyes: Negative for itching and visual disturbance.  Cardiovascular: Negative for chest pain, palpitations and leg swelling.  Gastrointestinal: Negative for nausea, diarrhea, blood in stool and abdominal distention.  Genitourinary: Negative for frequency and hematuria.  Musculoskeletal: Negative for back pain, joint swelling and gait problem.  Skin: Positive for rash.  Neurological: Negative for dizziness, tremors, speech difficulty and weakness.  Psychiatric/Behavioral: Negative for sleep disturbance, dysphoric mood and agitation. The patient is not nervous/anxious.    Wt Readings from Last 3 Encounters:  06/05/14 183 lb (83.008 kg)  03/20/14 175 lb 12.8 oz (79.742 kg)  02/11/14 181 lb (82.101 kg)   BP Readings from Last 3 Encounters:  06/05/14 140/88  03/20/14 130/72  02/11/14 140/80        Objective:   Physical Exam  Constitutional: He is oriented to person, place, and time. He appears well-developed and well-nourished. No distress.  HENT:  Head: Normocephalic and atraumatic.  Right Ear: External ear normal.  Left Ear: External ear normal.  Nose: Nose normal.  Mouth/Throat: Oropharynx is clear and moist. No oropharyngeal exudate.  Eyes: Conjunctivae and EOM are normal. Pupils are equal, round, and reactive to light. Right eye exhibits no discharge. Left eye exhibits no discharge. No scleral icterus.  Neck: Normal range of motion. Neck supple. No JVD present. No tracheal deviation present. No thyromegaly present.  Cardiovascular: Normal rate, regular rhythm, normal heart sounds and  intact distal pulses.  Exam reveals no gallop and no friction rub.   No murmur heard. Pulmonary/Chest: Effort normal and breath sounds normal. No stridor. No respiratory distress. He has no wheezes. He has no rales. He exhibits no tenderness.  Abdominal: Soft. Bowel sounds are normal. He exhibits no distension and no mass. There is no tenderness. There is no rebound and no guarding.  Genitourinary: Rectum normal, prostate normal and penis normal. Guaiac negative stool. No penile tenderness.  Musculoskeletal: Normal range of motion. He exhibits no edema or tenderness.  Lymphadenopathy:    He has no cervical adenopathy.  Neurological: He is alert and oriented to person, place, and time. He has normal reflexes. No cranial nerve deficit. He exhibits normal muscle tone. Coordination normal.  Skin: Skin is warm and dry. Rash (extensive intertrigo between buttocks; psoriasis) noted. He is not diaphoretic. No erythema. No pallor.  Psychiatric: He has a normal mood and affect. His behavior is normal. Judgment and thought content normal.  B wrists are tender w/ROM  Lab Results  Component Value Date   WBC 8.1 10/09/2013   HGB 16.3 10/09/2013   HCT 48.0 10/09/2013   PLT 380.0 10/09/2013   GLUCOSE 88 10/09/2013   CHOL 194 10/09/2013   TRIG 56.0 10/09/2013   HDL 45.70 10/09/2013   LDLDIRECT 133.6 01/25/2012   LDLCALC 137* 10/09/2013   ALT 32 10/09/2013   AST 28 10/09/2013   NA 136 10/09/2013   K 3.9 10/09/2013   CL 101 10/09/2013   CREATININE 0.8 10/09/2013   BUN 10 10/09/2013   CO2 26 10/09/2013   TSH 0.08* 10/09/2013   PSA 1.96 10/09/2013   HGBA1C 5.6 06/19/2006  Assessment & Plan:

## 2014-06-05 NOTE — Assessment & Plan Note (Signed)
12/15 clear of the virus (s/p Havroni Rx)

## 2014-06-05 NOTE — Assessment & Plan Note (Signed)
Continue with current prescription therapy as reflected on the Med list.  

## 2014-06-05 NOTE — Assessment & Plan Note (Signed)
Rhem consult  Dr Dareen PianoAnderson

## 2014-06-18 ENCOUNTER — Encounter: Payer: Self-pay | Admitting: Internal Medicine

## 2014-06-22 ENCOUNTER — Other Ambulatory Visit: Payer: Self-pay | Admitting: Internal Medicine

## 2014-10-18 ENCOUNTER — Other Ambulatory Visit: Payer: Self-pay | Admitting: Internal Medicine

## 2014-10-28 ENCOUNTER — Other Ambulatory Visit: Payer: Self-pay | Admitting: Internal Medicine

## 2014-12-29 ENCOUNTER — Telehealth: Payer: Self-pay | Admitting: Internal Medicine

## 2014-12-29 DIAGNOSIS — Z0000 Encounter for general adult medical examination without abnormal findings: Secondary | ICD-10-CM

## 2014-12-29 NOTE — Telephone Encounter (Signed)
I scheduled pt for his physical on 8/23 and he was wanting to do labs before his appointment. Please advise

## 2014-12-31 ENCOUNTER — Telehealth: Payer: Self-pay | Admitting: Internal Medicine

## 2014-12-31 DIAGNOSIS — L4050 Arthropathic psoriasis, unspecified: Secondary | ICD-10-CM

## 2014-12-31 NOTE — Telephone Encounter (Signed)
Patient was referred to [facility], but before the appointment Dr. Dierdre Forth left the practice and is now at [facility] and needs referral sent there.  Their fax # [phone] His appointment is 8/25

## 2014-12-31 NOTE — Telephone Encounter (Signed)
Labs ordered. Pt informed

## 2015-01-01 NOTE — Telephone Encounter (Signed)
OK. Thx

## 2015-01-12 ENCOUNTER — Other Ambulatory Visit (INDEPENDENT_AMBULATORY_CARE_PROVIDER_SITE_OTHER): Payer: Federal, State, Local not specified - PPO

## 2015-01-12 DIAGNOSIS — Z0000 Encounter for general adult medical examination without abnormal findings: Secondary | ICD-10-CM

## 2015-01-12 LAB — URINALYSIS, ROUTINE W REFLEX MICROSCOPIC
BILIRUBIN URINE: NEGATIVE
Hgb urine dipstick: NEGATIVE
Leukocytes, UA: NEGATIVE
NITRITE: NEGATIVE
PH: 5.5 (ref 5.0–8.0)
RBC / HPF: NONE SEEN (ref 0–?)
Specific Gravity, Urine: >=1.03 — AB (ref 1.000–1.030)
TOTAL PROTEIN, URINE-UPE24: NEGATIVE
URINE GLUCOSE: NEGATIVE
UROBILINOGEN UA: 0.2 (ref 0.0–1.0)

## 2015-01-12 LAB — LIPID PANEL
CHOL/HDL RATIO: 5
Cholesterol: 206 mg/dL — ABNORMAL HIGH (ref 0–200)
HDL: 38 mg/dL — AB (ref >39.00)
LDL CALC: 142 mg/dL — AB (ref 0–99)
NONHDL: 168.29
TRIGLYCERIDES: 130 mg/dL (ref 0.0–149.0)
VLDL: 26 mg/dL (ref 0.0–40.0)

## 2015-01-12 LAB — HEPATIC FUNCTION PANEL
ALT: 11 U/L (ref 0–53)
AST: 13 U/L (ref 0–37)
Albumin: 4.3 g/dL (ref 3.5–5.2)
Alkaline Phosphatase: 86 U/L (ref 39–117)
BILIRUBIN TOTAL: 0.5 mg/dL (ref 0.2–1.2)
Bilirubin, Direct: 0.1 mg/dL (ref 0.0–0.3)
Total Protein: 7 g/dL (ref 6.0–8.3)

## 2015-01-12 LAB — BASIC METABOLIC PANEL
BUN: 14 mg/dL (ref 6–23)
CALCIUM: 9.6 mg/dL (ref 8.4–10.5)
CHLORIDE: 101 meq/L (ref 96–112)
CO2: 28 mEq/L (ref 19–32)
CREATININE: 0.75 mg/dL (ref 0.40–1.50)
GFR: 112.52 mL/min (ref >60.00)
Glucose, Bld: 96 mg/dL (ref 70–99)
Potassium: 4.6 mEq/L (ref 3.5–5.1)
Sodium: 137 mEq/L (ref 135–145)

## 2015-01-12 LAB — CBC WITH DIFFERENTIAL/PLATELET
BASOS ABS: 0.1 10*3/uL (ref 0.0–0.1)
Basophils Relative: 0.6 % (ref 0.0–3.0)
EOS ABS: 0.7 10*3/uL (ref 0.0–0.7)
Eosinophils Relative: 6.3 % — ABNORMAL HIGH (ref 0.0–5.0)
HCT: 43.8 % (ref 39.0–52.0)
HEMOGLOBIN: 14.7 g/dL (ref 13.0–17.0)
Lymphocytes Relative: 21.6 % (ref 12.0–46.0)
Lymphs Abs: 2.4 10*3/uL (ref 0.7–4.0)
MCHC: 33.6 g/dL (ref 30.0–36.0)
MCV: 87.4 fl (ref 78.0–100.0)
MONOS PCT: 7.5 % (ref 3.0–12.0)
Monocytes Absolute: 0.8 10*3/uL (ref 0.1–1.0)
Neutro Abs: 7 10*3/uL (ref 1.4–7.7)
Neutrophils Relative %: 64 % (ref 43.0–77.0)
Platelets: 353 10*3/uL (ref 150.0–400.0)
RBC: 5.02 Mil/uL (ref 4.22–5.81)
RDW: 14.1 % (ref 11.5–15.5)
WBC: 10.9 10*3/uL — AB (ref 4.0–10.5)

## 2015-01-12 LAB — TSH: TSH: 1.36 u[IU]/mL (ref 0.35–4.50)

## 2015-01-12 LAB — PSA: PSA: 1.22 ng/mL (ref 0.10–4.00)

## 2015-01-13 ENCOUNTER — Encounter: Payer: Federal, State, Local not specified - PPO | Admitting: Internal Medicine

## 2015-01-15 ENCOUNTER — Ambulatory Visit (INDEPENDENT_AMBULATORY_CARE_PROVIDER_SITE_OTHER): Payer: Federal, State, Local not specified - PPO | Admitting: Internal Medicine

## 2015-01-15 ENCOUNTER — Encounter: Payer: Self-pay | Admitting: Internal Medicine

## 2015-01-15 VITALS — BP 138/88 | HR 62 | Ht 66.0 in | Wt 183.0 lb

## 2015-01-15 DIAGNOSIS — B182 Chronic viral hepatitis C: Secondary | ICD-10-CM | POA: Diagnosis not present

## 2015-01-15 DIAGNOSIS — Z23 Encounter for immunization: Secondary | ICD-10-CM | POA: Diagnosis not present

## 2015-01-15 DIAGNOSIS — L4050 Arthropathic psoriasis, unspecified: Secondary | ICD-10-CM | POA: Diagnosis not present

## 2015-01-15 DIAGNOSIS — L02214 Cutaneous abscess of groin: Secondary | ICD-10-CM | POA: Diagnosis not present

## 2015-01-15 DIAGNOSIS — Z0000 Encounter for general adult medical examination without abnormal findings: Secondary | ICD-10-CM

## 2015-01-15 MED ORDER — LEVOTHYROXINE SODIUM 125 MCG PO TABS: 125.0000 ug | ORAL_TABLET | Freq: Every day | ORAL | Status: DC

## 2015-01-15 MED ORDER — MEGARED OMEGA-3 KRILL OIL 500 MG PO CAPS: 1.0000 | ORAL_CAPSULE | Freq: Every morning | ORAL | Status: DC

## 2015-01-15 MED ORDER — DICLOFENAC SODIUM 75 MG PO TBEC: 75.0000 mg | DELAYED_RELEASE_TABLET | Freq: Two times a day (BID) | ORAL | Status: DC

## 2015-01-15 MED ORDER — DOXYCYCLINE HYCLATE 100 MG PO TABS: 100.0000 mg | ORAL_TABLET | Freq: Two times a day (BID) | ORAL | Status: DC

## 2015-01-15 NOTE — Assessment & Plan Note (Signed)
Dr Dierdre Forth appt today

## 2015-01-15 NOTE — Progress Notes (Signed)
Subjective:  Patient ID: Eric Griffith, male    DOB: 1955-11-07  Age: 59 y.o. MRN: [id number]  CC: Annual Exam   HPI Irvan Tiedt presents for a well exam. Cologuard neg in 2015. C/o a groin boil  Outpatient Prescriptions Prior to Visit  Medication Sig Dispense Refill  . Cholecalciferol (VITAMIN D3) 2000 UNITS capsule Take 1 capsule (2,000 Units total) by mouth daily. 100 capsule 3  . clobetasol cream (TEMOVATE) 0.05 % Apply topically 2 (two) times daily.      . diclofenac (VOLTAREN) 75 MG EC tablet TAKE 1 TABLET BY MOUTH TWICE DAILY 60 tablet 5  . diclofenac (VOLTAREN) 75 MG EC tablet TAKE 1 TABLET BY MOUTH TWICE DAILY 60 tablet 0  . fish oil-omega-3 fatty acids 1000 MG capsule Take 2 g by mouth 2 (two) times daily.      [name] levothyroxine (SYNTHROID, LEVOTHROID) 125 MCG tablet TAKE 1 TABLET BY MOUTH ONCE DAILY 90 tablet 3   No facility-administered medications prior to visit.    ROS Review of Systems  Constitutional: Negative for appetite change, fatigue and unexpected weight change.  HENT: Negative for congestion, nosebleeds, sneezing, sore throat and trouble swallowing.   Eyes: Negative for itching and visual disturbance.  Respiratory: Negative for cough.   Cardiovascular: Negative for chest pain, palpitations and leg swelling.  Gastrointestinal: Negative for nausea, diarrhea, blood in stool and abdominal distention.  Genitourinary: Negative for frequency and hematuria.  Musculoskeletal: Negative for back pain, joint swelling, gait problem and neck pain.  Skin: Negative for rash.  Neurological: Negative for dizziness, tremors, speech difficulty and weakness.  Psychiatric/Behavioral: Negative for suicidal ideas, sleep disturbance, dysphoric mood and agitation. The patient is not nervous/anxious.     Objective:  BP 138/88 mmHg  Pulse 62  Ht  (1.676 m)  Wt 183 lb (83.008 kg)  BMI 29.55 kg/m2  SpO2 97%  BP Readings from Last 3 Encounters:  01/15/15  138/88  06/05/14 140/88  03/20/14 130/72    Wt Readings from Last 3 Encounters:  01/15/15 183 lb (83.008 kg)  06/05/14 183 lb (83.008 kg)  03/20/14 175 lb 12.8 oz (79.742 kg)    Physical Exam  Constitutional: He is oriented to person, place, and time. He appears well-developed. No distress.  NAD  HENT:  Mouth/Throat: Oropharynx is clear and moist.  Eyes: Conjunctivae are normal. Pupils are equal, round, and reactive to light.  Neck: Normal range of motion. No JVD present. No thyromegaly present.  Cardiovascular: Normal rate, regular rhythm, normal heart sounds and intact distal pulses.  Exam reveals no gallop and no friction rub.   No murmur heard. Pulmonary/Chest: Effort normal and breath sounds normal. No respiratory distress. He has no wheezes. He has no rales. He exhibits no tenderness.  Abdominal: Soft. Bowel sounds are normal. He exhibits no distension and no mass. There is no tenderness. There is no rebound and no guarding.  Musculoskeletal: Normal range of motion. He exhibits no edema or tenderness.  Lymphadenopathy:    He has no cervical adenopathy.  Neurological: He is alert and oriented to person, place, and time. He has normal reflexes. No cranial nerve deficit. He exhibits normal muscle tone. He displays a negative Romberg sign. Coordination and gait normal.  Skin: Skin is warm and dry. No rash noted.  Psychiatric: He has a normal mood and affect. His behavior is normal. Judgment and thought content normal.  grape sized L scrotum base eryth sensitive nodule Psoriasis patches  Lab Results  Component Value Date   WBC 10.9* 01/12/2015   HGB 14.7 01/12/2015   HCT 43.8 01/12/2015   PLT 353.0 01/12/2015   GLUCOSE 96 01/12/2015   CHOL 206* 01/12/2015   TRIG 130.0 01/12/2015   HDL 38.00* 01/12/2015   LDLDIRECT 133.6 01/25/2012   LDLCALC 142* 01/12/2015   ALT 11 01/12/2015   AST 13 01/12/2015   NA 137 01/12/2015   K 4.6 01/12/2015   CL 101 01/12/2015   CREATININE  0.75 01/12/2015   BUN 14 01/12/2015   CO2 28 01/12/2015   TSH 1.36 01/12/2015   PSA 1.22 01/12/2015   HGBA1C 5.6 06/19/2006    Dg Hand Complete Left  03/20/2014   CLINICAL DATA:  Bilateral hand and wrist pain.  Psoriatic arthritis  EXAM: LEFT HAND - COMPLETE 3+ VIEW  COMPARISON:  None.  FINDINGS: Normal alignment no fracture. Mild joint space narrowing and osteoarthritis in the DIP joints. No psoriatic lesion identified. No erosion. Mild degenerative change in the radiocarpal joint.  IMPRESSION: Mild osteoarthritis.  Negative for psoriatic changes.   Electronically Signed   By: Marlan Palau M.D.   On: 03/20/2014 11:50   Dg Hand Complete Right  03/20/2014   CLINICAL DATA:  Bilateral hand and wrist pain.  Psoriatic arthritis  EXAM: RIGHT HAND - COMPLETE 3+ VIEW  COMPARISON:  None.  FINDINGS: Normal alignment. No fracture. MCP and interphalangeal joints are intact without cartilage loss or spurring  Small cortical erosion of the tip of the radial styloid. Radiocarpal joint intact.  IMPRESSION: Small erosion tip of the radial styloid.  Otherwise negative.   Electronically Signed   By: Marlan Palau M.D.   On: 03/20/2014 11:52    Assessment & Plan:   Karas was seen today for annual exam.  Diagnoses and all orders for this visit:  Well adult exam  PSORIATIC ARTHRITIS  Abscess, groin -     Ambulatory referral to Urology  Need for influenza vaccination -     Flu Vaccine QUAD 36+ mos IM  Other orders -     doxycycline (VIBRA-TABS) 100 MG tablet; Take 1 tablet (100 mg total) by mouth 2 (two) times daily. -     levothyroxine (SYNTHROID, LEVOTHROID) 125 MCG tablet; Take 1 tablet (125 mcg total) by mouth daily. -     diclofenac (VOLTAREN) 75 MG EC tablet; Take 1 tablet (75 mg total) by mouth 2 (two) times daily. -     MEGARED OMEGA-3 KRILL OIL 500 MG CAPS; Take 1 capsule by mouth every morning.  I have discontinued Mr. Salzman fish oil-omega-3 fatty acids and diclofenac. I have also  changed his levothyroxine and diclofenac. Additionally, I am having him start on doxycycline and MEGARED OMEGA-3 KRILL OIL. Lastly, I am having him maintain his clobetasol cream, Vitamin D3, and triamcinolone cream.  Meds ordered this encounter  Medications  . triamcinolone cream (KENALOG) 0.1 %    Sig: 2 (two) times daily as needed.  . doxycycline (VIBRA-TABS) 100 MG tablet    Sig: Take 1 tablet (100 mg total) by mouth 2 (two) times daily.    Dispense:  20 tablet    Refill:  1  . levothyroxine (SYNTHROID, LEVOTHROID) 125 MCG tablet    Sig: Take 1 tablet (125 mcg total) by mouth daily.    Dispense:  90 tablet    Refill:  3  . diclofenac (VOLTAREN) 75 MG EC tablet    Sig: Take 1 tablet (75 mg total) by mouth 2 (two) times daily.  Dispense:  180 tablet    Refill:  2    Yearly physical is due must see md for future refills  . MEGARED OMEGA-3 KRILL OIL 500 MG CAPS    Sig: Take 1 capsule by mouth every morning.    Dispense:  100 capsule    Refill:  3     Follow-up: Return in about 1 year (around 01/15/2016) for Wellness Exam.  Sonda Primes, MD

## 2015-01-15 NOTE — Assessment & Plan Note (Signed)
2016 s/p treatment

## 2015-01-15 NOTE — Assessment & Plan Note (Signed)
8/16 L scrotum base ? Infected sebaceous cyst

## 2015-01-15 NOTE — Assessment & Plan Note (Addendum)
We discussed age appropriate health related issues, including available/recomended screening tests and vaccinations. We discussed a need for adhering to healthy diet and exercise. Labs/EKG were reviewed/ordered. All questions were answered. Colon ?10 years ago Cologuard 2015 neg Flu shot

## 2015-01-15 NOTE — Progress Notes (Signed)
Pre visit review using our clinic review tool, if applicable. No additional management support is needed unless otherwise documented below in the visit note. 

## 2015-01-20 ENCOUNTER — Telehealth: Payer: Self-pay | Admitting: Internal Medicine

## 2015-01-20 NOTE — Telephone Encounter (Signed)
Rec'd from [facility] forward 4 pages to Dr. Posey Rea

## 2015-10-22 ENCOUNTER — Telehealth: Payer: Self-pay | Admitting: *Deleted

## 2015-10-22 MED ORDER — DICLOFENAC SODIUM 75 MG PO TBEC: 75.0000 mg | DELAYED_RELEASE_TABLET | Freq: Two times a day (BID) | ORAL | Status: DC

## 2015-10-22 NOTE — Telephone Encounter (Signed)
Receive fax pt requesting refill on his diclofenac. Refill sent electronically...Raechel Chute/lmb

## 2015-12-09 IMAGING — US US ABDOMEN COMPLETE
1 series · 14 of 25 positions shown · non-contrast
Comparison: Abdominal ultrasound August 28, 2007

CLINICAL DATA: Hepatitis-C carrier state

EXAM:
ULTRASOUND ABDOMEN COMPLETE

[Series 1: us abdomen complete · 0.35mm/px · 14 of 69 slices shown]
[im 1/69]
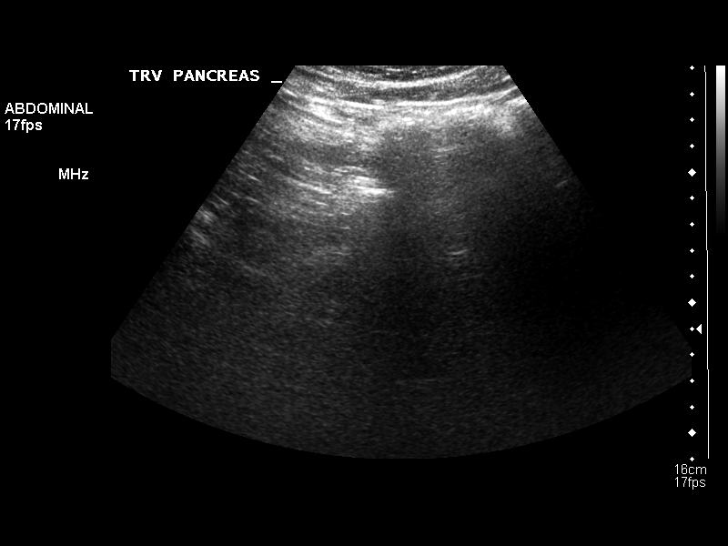
[im 6/69]
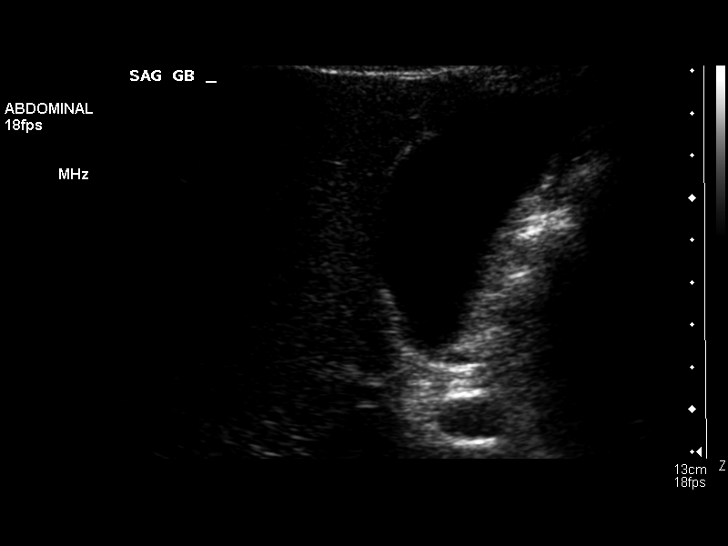
[im 12/69]
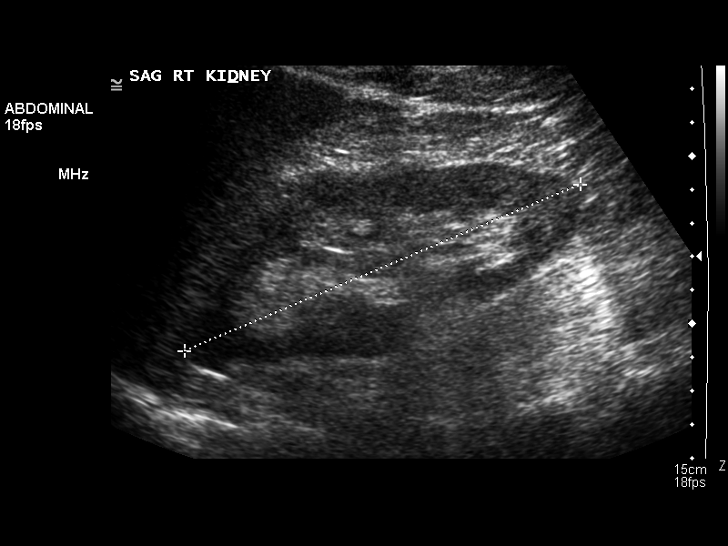
[im 18/69]
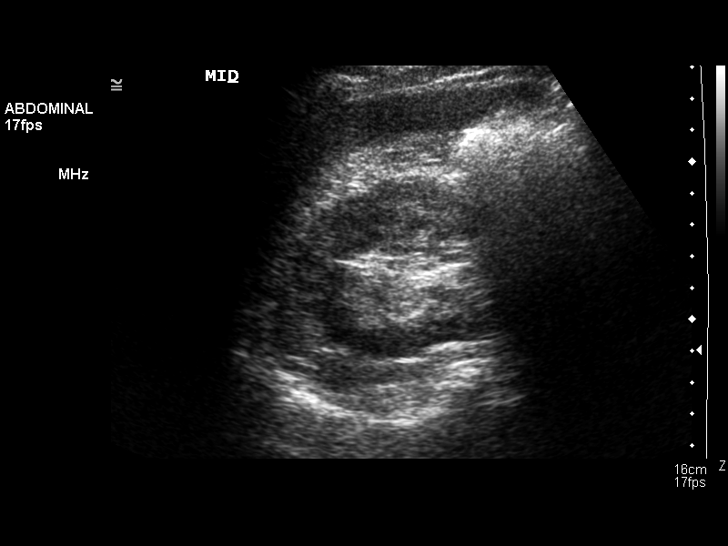
[im 23/69]
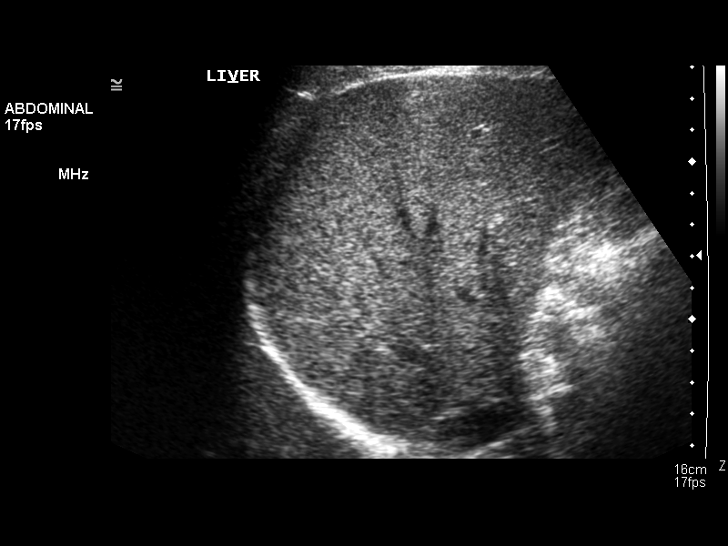
[im 26/69]
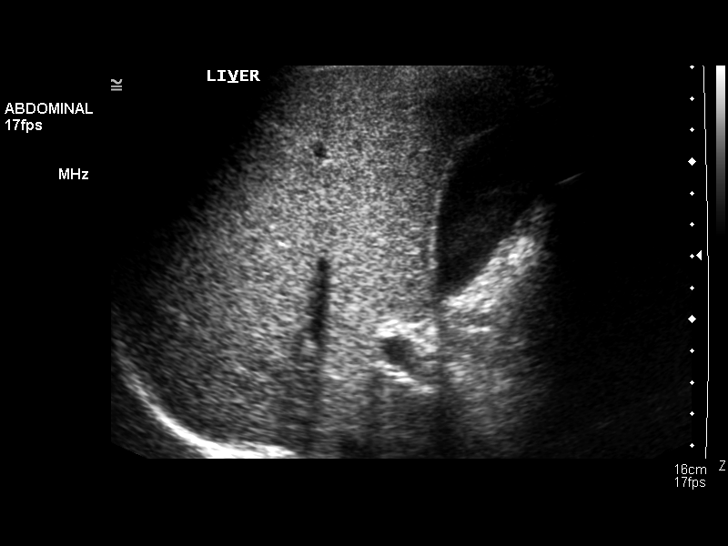
[im 32/69]
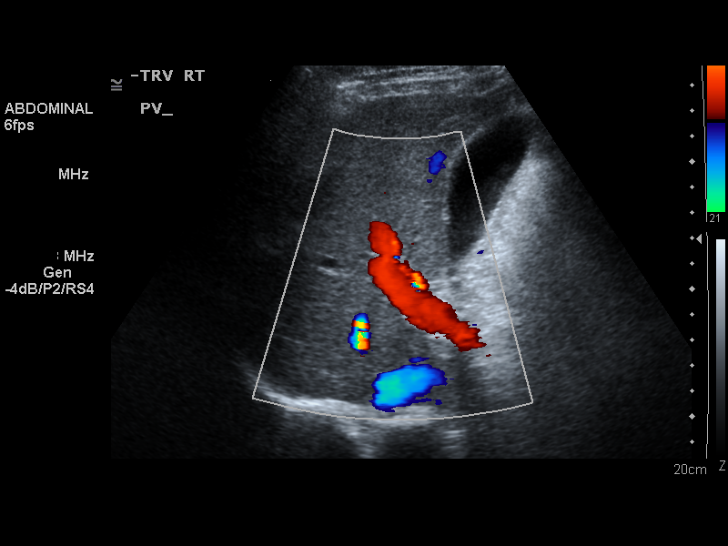
[im 37/69]
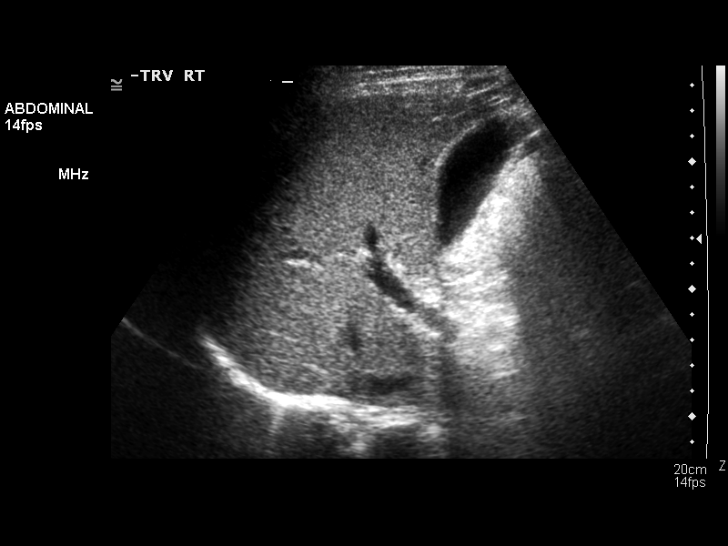
[im 43/69]
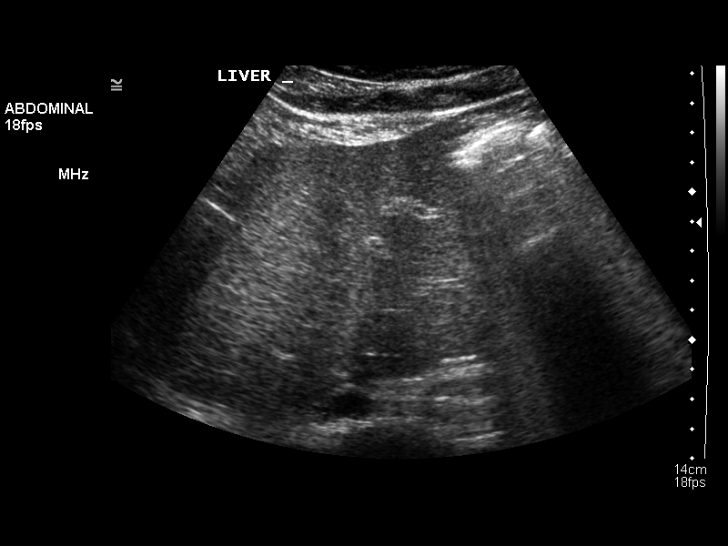
[im 46/69]
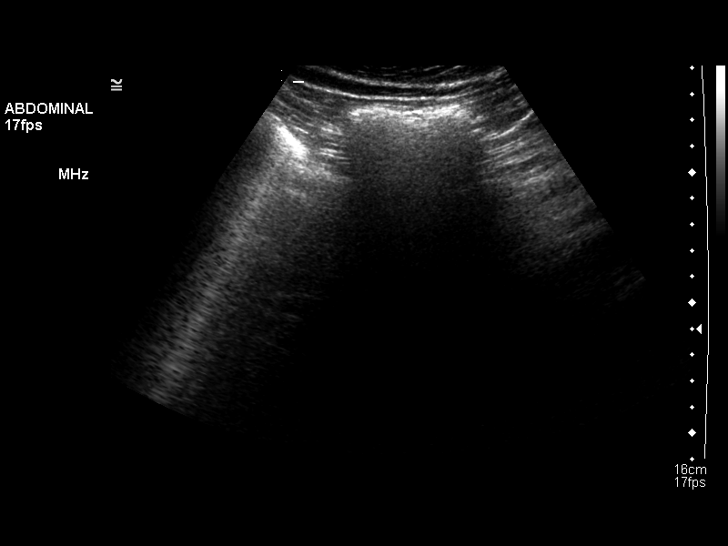
[im 52/69]
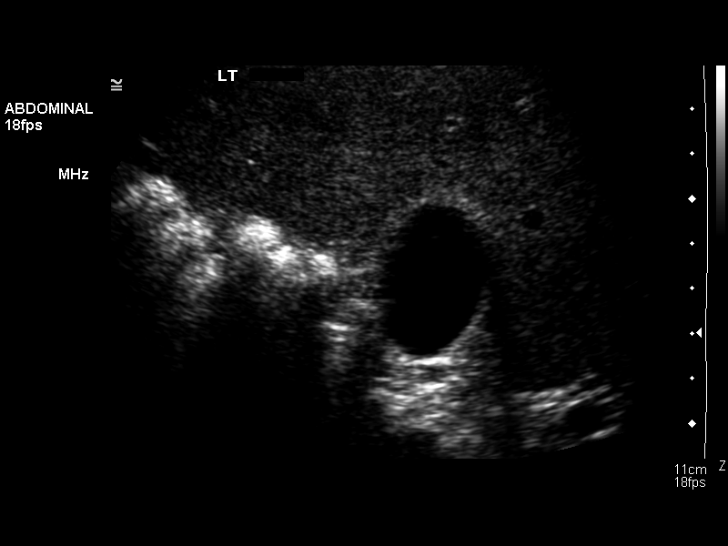
[im 57/69]
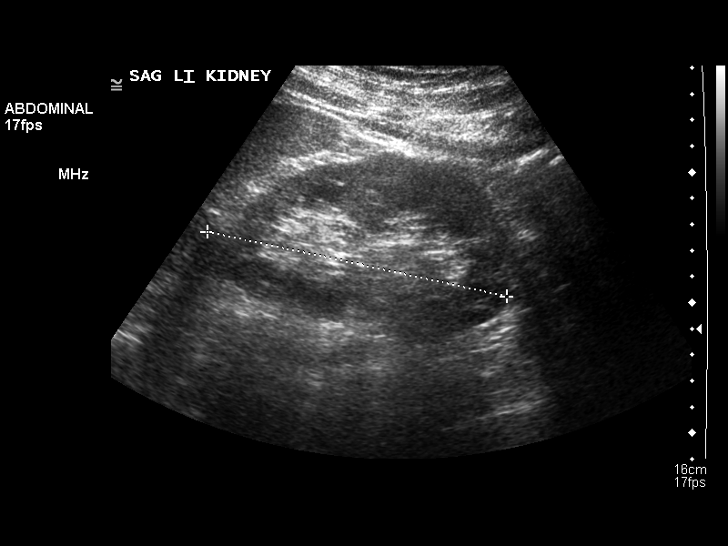
[im 63/69]
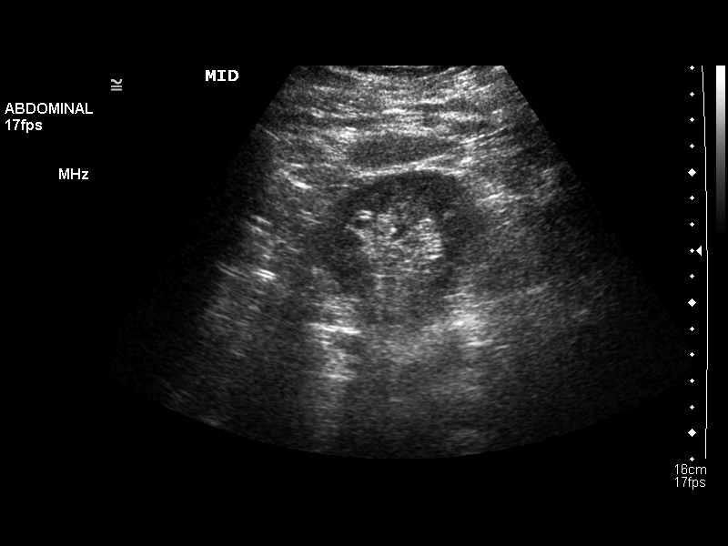
[im 69/69]
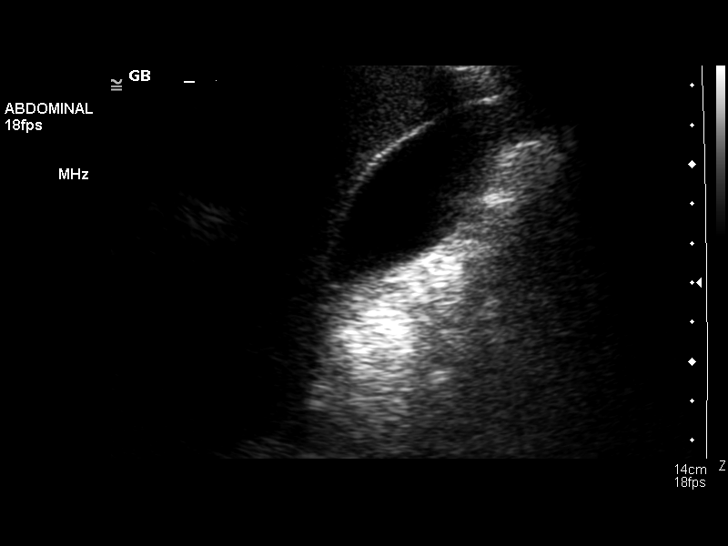

[14 of 25 positions shown; findings below may reference images not displayed]

FINDINGS: Gallbladder:

No gallstones or wall thickening visualized. No sonographic Murphy
sign noted.

Common bile duct:

Diameter: 3.6 mm

Liver:

No focal lesion identified. The echotexture is heterogeneous. There
is no intrahepatic ductal dilation.

IVC:

Visualized portions are unremarkable.

Pancreas:

The pancreas was obscured by bowel gas.

Spleen:

Size and appearance within normal limits.

Right Kidney:

Length: 12.8 cm. Echogenicity within normal limits. No mass or
hydronephrosis visualized.

Left Kidney:

Length: 12.3 cm. Echogenicity within normal limits. No mass or
hydronephrosis visualized.

Abdominal aorta:

Proximal aorta is obscured but more distally the aorta is normal in
caliber.

Other findings:

There is no ascites.
IMPRESSION: There is no acute intra-abdominal abnormality. The liver exhibits no
acute abnormality.

## 2016-03-16 ENCOUNTER — Other Ambulatory Visit: Payer: Self-pay | Admitting: Internal Medicine

## 2016-04-08 ENCOUNTER — Encounter: Payer: Federal, State, Local not specified - PPO | Admitting: Internal Medicine

## 2016-04-18 ENCOUNTER — Ambulatory Visit (INDEPENDENT_AMBULATORY_CARE_PROVIDER_SITE_OTHER)
Admission: RE | Admit: 2016-04-18 | Discharge: 2016-04-18 | Disposition: A | Discharge status: Home / Self Care | Payer: Federal, State, Local not specified - PPO | Source: Ambulatory Visit | Attending: Internal Medicine | Admitting: Internal Medicine

## 2016-04-18 ENCOUNTER — Other Ambulatory Visit (INDEPENDENT_AMBULATORY_CARE_PROVIDER_SITE_OTHER): Payer: Federal, State, Local not specified - PPO

## 2016-04-18 ENCOUNTER — Ambulatory Visit (INDEPENDENT_AMBULATORY_CARE_PROVIDER_SITE_OTHER): Payer: Federal, State, Local not specified - PPO | Admitting: Internal Medicine

## 2016-04-18 ENCOUNTER — Encounter: Payer: Self-pay | Admitting: Internal Medicine

## 2016-04-18 VITALS — BP 134/82 | HR 65 | Temp 98.5°F | Resp 14 | Ht 66.0 in | Wt 187.4 lb

## 2016-04-18 DIAGNOSIS — E034 Atrophy of thyroid (acquired): Secondary | ICD-10-CM | POA: Diagnosis not present

## 2016-04-18 DIAGNOSIS — Z23 Encounter for immunization: Secondary | ICD-10-CM | POA: Diagnosis not present

## 2016-04-18 DIAGNOSIS — E785 Hyperlipidemia, unspecified: Secondary | ICD-10-CM | POA: Diagnosis not present

## 2016-04-18 DIAGNOSIS — R05 Cough: Secondary | ICD-10-CM | POA: Insufficient documentation

## 2016-04-18 DIAGNOSIS — Z0000 Encounter for general adult medical examination without abnormal findings: Secondary | ICD-10-CM

## 2016-04-18 DIAGNOSIS — R7989 Other specified abnormal findings of blood chemistry: Secondary | ICD-10-CM

## 2016-04-18 DIAGNOSIS — R030 Elevated blood-pressure reading, without diagnosis of hypertension: Secondary | ICD-10-CM | POA: Diagnosis not present

## 2016-04-18 DIAGNOSIS — R059 Cough, unspecified: Secondary | ICD-10-CM | POA: Insufficient documentation

## 2016-04-18 DIAGNOSIS — H6122 Impacted cerumen, left ear: Secondary | ICD-10-CM

## 2016-04-18 LAB — URINALYSIS
BILIRUBIN URINE: NEGATIVE
HGB URINE DIPSTICK: NEGATIVE
Ketones, ur: NEGATIVE
Leukocytes, UA: NEGATIVE
Nitrite: NEGATIVE
PH: 5.5 (ref 5.0–8.0)
Specific Gravity, Urine: 1.02 (ref 1.000–1.030)
TOTAL PROTEIN, URINE-UPE24: NEGATIVE
URINE GLUCOSE: NEGATIVE
Urobilinogen, UA: 0.2 (ref 0.0–1.0)

## 2016-04-18 LAB — HEPATIC FUNCTION PANEL
ALK PHOS: 85 U/L (ref 39–117)
ALT: 14 U/L (ref 0–53)
AST: 16 U/L (ref 0–37)
Albumin: 4.5 g/dL (ref 3.5–5.2)
BILIRUBIN DIRECT: 0.1 mg/dL (ref 0.0–0.3)
TOTAL PROTEIN: 7.4 g/dL (ref 6.0–8.3)
Total Bilirubin: 0.5 mg/dL (ref 0.2–1.2)

## 2016-04-18 LAB — CBC WITH DIFFERENTIAL/PLATELET
BASOS ABS: 0.1 10*3/uL (ref 0.0–0.1)
Basophils Relative: 0.7 % (ref 0.0–3.0)
EOS ABS: 0.9 10*3/uL — AB (ref 0.0–0.7)
Eosinophils Relative: 7.8 % — ABNORMAL HIGH (ref 0.0–5.0)
HCT: 46.1 % (ref 39.0–52.0)
Hemoglobin: 15.5 g/dL (ref 13.0–17.0)
LYMPHS ABS: 2.1 10*3/uL (ref 0.7–4.0)
Lymphocytes Relative: 18.9 % (ref 12.0–46.0)
MCHC: 33.7 g/dL (ref 30.0–36.0)
MCV: 87.8 fl (ref 78.0–100.0)
Monocytes Absolute: 0.9 10*3/uL (ref 0.1–1.0)
Monocytes Relative: 8.2 % (ref 3.0–12.0)
NEUTROS PCT: 64.4 % (ref 43.0–77.0)
Neutro Abs: 7.2 10*3/uL (ref 1.4–7.7)
Platelets: 383 10*3/uL (ref 150.0–400.0)
RBC: 5.25 Mil/uL (ref 4.22–5.81)
RDW: 14 % (ref 11.5–15.5)
WBC: 11.2 10*3/uL — ABNORMAL HIGH (ref 4.0–10.5)

## 2016-04-18 LAB — LIPID PANEL
CHOL/HDL RATIO: 4
Cholesterol: 215 mg/dL — ABNORMAL HIGH (ref 0–200)
HDL: 49.4 mg/dL (ref >39.00)
LDL CALC: 147 mg/dL — AB (ref 0–99)
NONHDL: 165.55
Triglycerides: 91 mg/dL (ref 0.0–149.0)
VLDL: 18.2 mg/dL (ref 0.0–40.0)

## 2016-04-18 LAB — BASIC METABOLIC PANEL
BUN: 13 mg/dL (ref 6–23)
CALCIUM: 9.7 mg/dL (ref 8.4–10.5)
CHLORIDE: 99 meq/L (ref 96–112)
CO2: 30 mEq/L (ref 19–32)
Creatinine, Ser: 0.75 mg/dL (ref 0.40–1.50)
GFR: 112.05 mL/min (ref >60.00)
Glucose, Bld: 99 mg/dL (ref 70–99)
Potassium: 4.6 mEq/L (ref 3.5–5.1)
SODIUM: 136 meq/L (ref 135–145)

## 2016-04-18 LAB — TSH: TSH: 1.55 u[IU]/mL (ref 0.35–4.50)

## 2016-04-18 LAB — PSA: PSA: 1.65 ng/mL (ref 0.10–4.00)

## 2016-04-18 NOTE — Assessment & Plan Note (Signed)
Labs

## 2016-04-18 NOTE — Assessment & Plan Note (Signed)
labs

## 2016-04-18 NOTE — Assessment & Plan Note (Signed)
Mild CXR 

## 2016-04-18 NOTE — Assessment & Plan Note (Signed)
We discussed age appropriate health related issues, including available/recomended screening tests and vaccinations. We discussed a need for adhering to healthy diet and exercise. Labs/EKG were reviewed/ordered. All questions were answered.  Cologuard due 2018

## 2016-04-18 NOTE — Assessment & Plan Note (Signed)
Will recheck BP

## 2016-04-18 NOTE — Assessment & Plan Note (Signed)
He will irrigate at home 

## 2016-04-18 NOTE — Progress Notes (Signed)
Subjective:  Patient ID: Eric Overlielwood Yates Coover, male    DOB: February 26, 1956  Age: 60 y.o. MRN: [id number]  CC: No chief complaint on file.   HPI Eric Griffith presents for a well exam  Outpatient Medications Prior to Visit  Medication Sig Dispense Refill  . Cholecalciferol (VITAMIN D3) 2000 UNITS capsule Take 1 capsule (2,000 Units total) by mouth daily. 100 capsule 3  . diclofenac (VOLTAREN) 75 MG EC tablet Take 1 tablet (75 mg total) by mouth 2 (two) times daily. Yearly physical due in August must see MD for refills 180 tablet 0  . levothyroxine (SYNTHROID, LEVOTHROID) 125 MCG tablet TAKE 1 TABLET BY MOUTH EVERY DAY 90 tablet 0  . MEGARED OMEGA-3 KRILL OIL 500 MG CAPS Take 1 capsule by mouth every morning. 100 capsule 3  . triamcinolone cream (KENALOG) 0.1 % 2 (two) times daily as needed.    . clobetasol cream (TEMOVATE) 0.05 % Apply topically 2 (two) times daily.      [name]. doxycycline (VIBRA-TABS) 100 MG tablet Take 1 tablet (100 mg total) by mouth 2 (two) times daily. (Patient not taking: Reported on 04/18/2016) 20 tablet 1   No facility-administered medications prior to visit.     ROS Review of Systems  Constitutional: Negative for appetite change, fatigue and unexpected weight change.  HENT: Negative for congestion, nosebleeds, sneezing, sore throat and trouble swallowing.   Eyes: Negative for itching and visual disturbance.  Respiratory: Negative for cough.   Cardiovascular: Negative for chest pain, palpitations and leg swelling.  Gastrointestinal: Negative for abdominal distention, blood in stool, diarrhea and nausea.  Genitourinary: Negative for frequency and hematuria.  Musculoskeletal: Negative for back pain, gait problem, joint swelling and neck pain.  Skin: Negative for rash.  Neurological: Negative for dizziness, tremors, speech difficulty and weakness.  Psychiatric/Behavioral: Negative for agitation, dysphoric mood, sleep disturbance and suicidal ideas. The patient  is not nervous/anxious.     Objective:  BP 134/82   Pulse 65   Temp 98.5 F (36.9 C) (Oral)   Resp 14   Ht 5\' 6"  (1.676 m)   Wt 187 lb 6.4 oz (85 kg)   SpO2 98%   BMI 30.25 kg/m   BP Readings from Last 3 Encounters:  04/18/16 134/82  01/15/15 138/88  06/05/14 140/88    Wt Readings from Last 3 Encounters:  04/18/16 187 lb 6.4 oz (85 kg)  01/15/15 183 lb (83 kg)  06/05/14 183 lb (83 kg)    Physical Exam  Constitutional: He is oriented to person, place, and time. He appears well-developed and well-nourished. No distress.  HENT:  Head: Normocephalic and atraumatic.  Right Ear: External ear normal.  Left Ear: External ear normal.  Nose: Nose normal.  Mouth/Throat: Oropharynx is clear and moist. No oropharyngeal exudate.  Eyes: Conjunctivae and EOM are normal. Pupils are equal, round, and reactive to light. Right eye exhibits no discharge. Left eye exhibits no discharge. No scleral icterus.  Neck: Normal range of motion. Neck supple. No JVD present. No tracheal deviation present. No thyromegaly present.  Cardiovascular: Normal rate, regular rhythm, normal heart sounds and intact distal pulses.  Exam reveals no gallop and no friction rub.   No murmur heard. Pulmonary/Chest: Effort normal and breath sounds normal. No stridor. No respiratory distress. He has no wheezes. He has no rales. He exhibits no tenderness.  Abdominal: Soft. Bowel sounds are normal. He exhibits no distension and no mass. There is no tenderness. There is no rebound and no guarding.  Genitourinary: Rectum normal and prostate normal. Rectal exam shows guaiac negative stool. No penile tenderness.  Musculoskeletal: Normal range of motion. He exhibits no edema or tenderness.  Lymphadenopathy:    He has no cervical adenopathy.  Neurological: He is alert and oriented to person, place, and time. He has normal reflexes. No cranial nerve deficit. He exhibits normal muscle tone. Coordination normal.  Skin: Skin is warm  and dry. Rash noted. He is not diaphoretic. No erythema. No pallor.  Psychiatric: He has a normal mood and affect. His behavior is normal. Judgment and thought content normal.  wax L ear Rash on buttocks  Lab Results  Component Value Date   WBC 10.9 (H) 01/12/2015   HGB 14.7 01/12/2015   HCT 43.8 01/12/2015   PLT 353.0 01/12/2015   GLUCOSE 96 01/12/2015   CHOL 206 (H) 01/12/2015   TRIG 130.0 01/12/2015   HDL 38.00 (L) 01/12/2015   LDLDIRECT 133.6 01/25/2012   LDLCALC 142 (H) 01/12/2015   ALT 11 01/12/2015   AST 13 01/12/2015   NA 137 01/12/2015   K 4.6 01/12/2015   CL 101 01/12/2015   CREATININE 0.75 01/12/2015   BUN 14 01/12/2015   CO2 28 01/12/2015   TSH 1.36 01/12/2015   PSA 1.22 01/12/2015   HGBA1C 5.6 06/19/2006    Dg Hand Complete Left  Result Date: 03/20/2014 CLINICAL DATA:  Bilateral hand and wrist pain.  Psoriatic arthritis EXAM: LEFT HAND - COMPLETE 3+ VIEW COMPARISON:  None. FINDINGS: Normal alignment no fracture. Mild joint space narrowing and osteoarthritis in the DIP joints. No psoriatic lesion identified. No erosion. Mild degenerative change in the radiocarpal joint. IMPRESSION: Mild osteoarthritis.  Negative for psoriatic changes. Electronically Signed   By: Marlan Palauharles  Clark M.D.   On: 03/20/2014 11:50   Dg Hand Complete Right  Result Date: 03/20/2014 CLINICAL DATA:  Bilateral hand and wrist pain.  Psoriatic arthritis EXAM: RIGHT HAND - COMPLETE 3+ VIEW COMPARISON:  None. FINDINGS: Normal alignment. No fracture. MCP and interphalangeal joints are intact without cartilage loss or spurring Small cortical erosion of the tip of the radial styloid. Radiocarpal joint intact. IMPRESSION: Small erosion tip of the radial styloid.  Otherwise negative. Electronically Signed   By: Marlan Palauharles  Clark M.D.   On: 03/20/2014 11:52    Assessment & Plan:   Diagnoses and all orders for this visit:  Dyslipidemia  Elevated BP without diagnosis of hypertension  Well adult  exam  Hypothyroidism due to acquired atrophy of thyroid  Impacted cerumen of left ear   I am having Eric Griffith maintain his clobetasol cream, Vitamin D3, triamcinolone cream, doxycycline, MEGARED OMEGA-3 KRILL OIL, diclofenac, and levothyroxine.  No orders of the defined types were placed in this encounter.    Follow-up: No Follow-up on file.  Sonda PrimesAlex Porshia Blizzard, MD

## 2016-04-18 NOTE — Progress Notes (Signed)
Pre visit review using our clinic review tool, if applicable. No additional management support is needed unless otherwise documented below in the visit note. 

## 2016-06-14 ENCOUNTER — Other Ambulatory Visit: Payer: Self-pay | Admitting: Internal Medicine

## 2016-06-15 ENCOUNTER — Encounter: Payer: Self-pay | Admitting: Gastroenterology

## 2016-07-19 ENCOUNTER — Telehealth: Payer: Self-pay

## 2016-07-19 NOTE — Telephone Encounter (Signed)
Rx request for Diclofenac Sodium 75 mg dr tablets, LOV:04/18/2016 No new appt Last refilled 10/22/2015

## 2016-07-20 NOTE — Telephone Encounter (Signed)
OK to fill this/these prescription(s) with additional refills x3  Thank you!  

## 2016-07-21 ENCOUNTER — Other Ambulatory Visit: Payer: Self-pay | Admitting: *Deleted

## 2016-07-21 MED ORDER — DICLOFENAC SODIUM 75 MG PO TBEC: 75.0000 mg | DELAYED_RELEASE_TABLET | Freq: Two times a day (BID) | ORAL | 1 refills | Status: DC

## 2016-08-17 ENCOUNTER — Telehealth: Payer: Self-pay

## 2016-08-17 NOTE — Telephone Encounter (Signed)
Sure. Thanks 

## 2016-08-17 NOTE — Telephone Encounter (Signed)
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 He is requesting to continue his care with Dr. Marina GoodellPerry. His wife states that she sees Dr. Marina GoodellPerry as well. A recall letter was sent out from Dr. Lavon PaganiniNandigam, but the pt hasn't seen her yet.

## 2016-08-17 NOTE — Telephone Encounter (Signed)
See note from Dr. Perry below. 

## 2016-08-17 NOTE — Telephone Encounter (Signed)
Dr. Marina GoodellPerry will you accept this previous Arlyce DiceKaplan pt? Please advise.

## 2016-08-18 ENCOUNTER — Ambulatory Visit (INDEPENDENT_AMBULATORY_CARE_PROVIDER_SITE_OTHER): Payer: Federal, State, Local not specified - PPO | Admitting: Internal Medicine

## 2016-08-18 ENCOUNTER — Encounter: Payer: Self-pay | Admitting: Internal Medicine

## 2016-08-18 DIAGNOSIS — L03211 Cellulitis of face: Secondary | ICD-10-CM | POA: Insufficient documentation

## 2016-08-18 MED ORDER — LEVOFLOXACIN 500 MG PO TABS: 500.0000 mg | ORAL_TABLET | Freq: Every day | ORAL | 0 refills | Status: AC

## 2016-08-18 NOTE — Progress Notes (Signed)
Pre visit review using our clinic review tool, if applicable. No additional management support is needed unless otherwise documented below in the visit note. 

## 2016-08-18 NOTE — Assessment & Plan Note (Signed)
Moderate to start now improved but still relatively large area affected; pt concerned about risk of clindamcin, ok to change to levaquin for skin /soft tissue infection indication, ok for priobiotic for any diarrhea onset

## 2016-08-18 NOTE — Progress Notes (Signed)
Subjective:    Patient ID: Eric Griffith, male    DOB: Dec 12, 1955, 61 y.o.   MRN: [id number]  HPI  Here to f/u local UC visit with symptoms of achy all over, at least one shaking chill, wife made him go to UC, had fever to approx 101, also left facial redness swelling and tender to touch; ? Saliva gland infection - tx with clindamycin 300 tid.  Overall good compliance with treatment, and good medicine tolerability. Fever much improved, but red/swelling tender area improved but still some left.  Still itches occasionally, left ear a bit achy this am.  No ST, HA, Pt denies chest pain, increased sob or doe, wheezing, orthopnea, PND, increased LE swelling, palpitations, dizziness or syncope.  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 BUNDLE BRANCH BLOCK, RIGHT 08/15/2007   present for long time  . ERECTILE DYSFUNCTION 02/12/2007  . HEPATITIS C 02/12/2007  . Hyperlipidemia   . HYPOTHYROIDISM 04/23/2007  . INSOMNIA, PERSISTENT 06/12/2008  . PSORIASIS 02/12/2007  . PSORIATIC ARTHRITIS 04/23/2007   No past surgical history on file.  reports that he has quit smoking. He has never used smokeless tobacco. He reports that he does not drink alcohol or use drugs. family history includes Cancer in his father and mother. Allergies  Allergen Reactions  . Lovastatin     REACTION: aches   Current Outpatient Prescriptions on File Prior to Visit  Medication Sig Dispense Refill  . Cholecalciferol (VITAMIN D3) 2000 UNITS capsule Take 1 capsule (2,000 Units total) by mouth daily. 100 capsule 3  . clobetasol cream (TEMOVATE) 0.05 % Apply topically 2 (two) times daily.      . diclofenac (VOLTAREN) 75 MG EC tablet Take 1 tablet (75 mg total) by mouth 2 (two) times daily. 180 tablet 1  . levothyroxine (SYNTHROID, LEVOTHROID) 125 MCG  tablet TAKE 1 TABLET BY MOUTH EVERY DAY 90 tablet 1  . MEGARED OMEGA-3 KRILL OIL 500 MG CAPS Take 1 capsule by mouth every morning. 100 capsule 3  . triamcinolone cream (KENALOG) 0.1 % 2 (two) times daily as needed.     No current facility-administered medications on file prior to visit.    Review of Systems All otherwise neg per pt     Objective:   Physical Exam BP 134/84   Pulse 64   Temp 98.2 F (36.8 C) (Oral)   Ht 5\' 6"  (1.676 m)   Wt 181 lb (82.1 kg)   SpO2 98%   BMI 29.21 kg/m  VS noted, non toxic Constitutional: Pt appears in no apparent distress HENT: Head: NCAT.  Right Ear: External ear normal.  Left Ear: External ear normal.  Bilat tm's with trace erythema only, no effusion.  Max sinus areas non tender.  Pharynx with no significant erythema, no exudate Eyes: . Pupils are equal, round, and reactive to light. Conjunctivae and EOM are normal Left face with 8 cm by 2 cm area preauricular starting just above the ear and extending to the left submandibular area with tender, res, mild swelling and several tender left submandibular LA No significant pinna erythema or scalp redness or LA otherwise about the ear or neck Neck: Normal range of motion. Neck supple.  Cardiovascular: Normal rate and regular rhythm.   Pulmonary/Chest: Effort normal and breath sounds without  rales or wheezing.  Neurological: Pt is alert. Not confused , motor grossly intact Skin: Skin is warm. No rash, no LE edema Psychiatric: Pt behavior is normal. No agitation.  No other exam findings    Assessment & Plan:

## 2016-08-18 NOTE — Patient Instructions (Signed)
OK to stop the clindamycin  Please take all new medication as prescribed - the levaquin  Please continue all other medications as before, and refills have been done if requested.  Please have the pharmacy call with any other refills you may need  Please keep your appointments with your specialists as you may have planned

## 2016-08-23 NOTE — Telephone Encounter (Signed)
Informed the patients wife that Dr. Marina Goodell accepted. They would like a morning procedure will be calling back at the end of April to schedule for July.

## 2016-09-26 ENCOUNTER — Encounter: Payer: Self-pay | Admitting: Internal Medicine

## 2016-11-28 ENCOUNTER — Ambulatory Visit (AMBULATORY_SURGERY_CENTER): Payer: Self-pay

## 2016-11-28 VITALS — Ht 66.0 in | Wt 187.2 lb

## 2016-11-28 DIAGNOSIS — Z1211 Encounter for screening for malignant neoplasm of colon: Secondary | ICD-10-CM

## 2016-11-28 MED ORDER — NA SULFATE-K SULFATE-MG SULF 17.5-3.13-1.6 GM/177ML PO SOLN: 1.0000 | Freq: Once | ORAL | 0 refills | Status: AC

## 2016-11-28 NOTE — Progress Notes (Signed)
Denies allergies to eggs or soy products. Denies complication of anesthesia or sedation. Denies use of weight loss medication. Denies use of O2.   Emmi instructions declined.  

## 2016-12-12 ENCOUNTER — Encounter: Payer: Self-pay | Admitting: Internal Medicine

## 2016-12-12 ENCOUNTER — Ambulatory Visit (AMBULATORY_SURGERY_CENTER): Payer: Federal, State, Local not specified - PPO | Admitting: Internal Medicine

## 2016-12-12 VITALS — BP 145/85 | HR 55 | Temp 96.5°F | Resp 15 | Ht 66.0 in | Wt 187.0 lb

## 2016-12-12 DIAGNOSIS — Z1211 Encounter for screening for malignant neoplasm of colon: Secondary | ICD-10-CM | POA: Diagnosis not present

## 2016-12-12 DIAGNOSIS — Z1212 Encounter for screening for malignant neoplasm of rectum: Secondary | ICD-10-CM

## 2016-12-12 MED ORDER — SODIUM CHLORIDE 0.9 % IV SOLN
500.0000 mL | INTRAVENOUS | Status: AC
Start: 2016-12-12 — End: 2017-12-12

## 2016-12-12 NOTE — Op Note (Signed)
[facility] Patient Name: Eric Griffith Procedure Date: 12/12/2016 8:08 AM MRN: [id number] Endoscopist: Wilhemina BonitoJohn N. Marina GoodellPerry , MD Age: [age] Referring MD:  Date of Birth: 08/10/1955 Gender: Male Account #: [id number] Procedure:                Colonoscopy Indications:              Screening for colorectal malignant neoplasm.                            Negative index exam 2008 ([name]) Medicines:                Monitored Anesthesia Care Procedure:                Pre-Anesthesia Assessment:                           - Prior to the procedure, a History and Physical                            was performed, and patient medications and                            allergies were reviewed. The patient's tolerance of                            previous anesthesia was also reviewed. The risks                            and benefits of the procedure and the sedation                            options and risks were discussed with the patient.                            All questions were answered, and informed consent                            was obtained. Prior Anticoagulants: The patient has                            taken no previous anticoagulant or antiplatelet                            agents. ASA Grade Assessment: II - A patient with                            mild systemic disease. After reviewing the risks                            and benefits, the patient was deemed in                            satisfactory condition to undergo the procedure.  After obtaining informed consent, the colonoscope                            was passed under direct vision. Throughout the                            procedure, the patient's blood pressure, pulse, and                            oxygen saturations were monitored continuously. The                            Colonoscope was introduced through the anus and                            advanced to the the cecum,  identified by                            appendiceal orifice and ileocecal valve. The                            ileocecal valve, appendiceal orifice, and rectum                            were photographed. The quality of the bowel                            preparation was excellent. The colonoscopy was                            performed without difficulty. The patient tolerated                            the procedure well. The bowel preparation used was                            SUPREP. Scope In: 8:13:59 AM Scope Out: 8:30:46 AM Scope Withdrawal Time: 0 hours 14 minutes 2 seconds  Total Procedure Duration: 0 hours 16 minutes 47 seconds  Findings:                 Multiple small and large-mouthed diverticula were                            found in the left colon.                           Internal hemorrhoids were found during retroflexion.                           The exam was otherwise without abnormality on                            direct and retroflexion views. Complications:            No immediate complications. Estimated  blood loss:                            None. Estimated Blood Loss:     Estimated blood loss: none. Impression:               - Diverticulosis in the left colon.                           - Internal hemorrhoids.                           - The examination was otherwise normal on direct                            and retroflexion views.                           - No specimens collected. Recommendation:           - Repeat colonoscopy in 10 years for screening                            purposes.                           - Patient has a contact number available for                            emergencies. The signs and symptoms of potential                            delayed complications were discussed with the                            patient. Return to normal activities tomorrow.                            Written discharge instructions were provided  to the                            patient.                           - Resume previous diet.                           - Continue present medications. Wilhemina Bonito. Marina Goodell, MD 12/12/2016 8:34:05 AM This report has been signed electronically.

## 2016-12-12 NOTE — Progress Notes (Signed)
No problems noted in the recovery room. maw 

## 2016-12-12 NOTE — Patient Instructions (Signed)
YOU HAD AN ENDOSCOPIC PROCEDURE TODAY AT THE [facility]:   Refer to the procedure report that was given to you for any specific questions about what was found during the examination.  If the procedure report does not answer your questions, please call your gastroenterologist to clarify.  If you requested that your care partner not be given the details of your procedure findings, then the procedure report has been included in a sealed envelope for you to review at your convenience later.  YOU SHOULD EXPECT: Some feelings of bloating in the abdomen. Passage of more gas than usual.  Walking can help get rid of the air that was put into your GI tract during the procedure and reduce the bloating. If you had a lower endoscopy (such as a colonoscopy or flexible sigmoidoscopy) you may notice spotting of blood in your stool or on the toilet paper. If you underwent a bowel prep for your procedure, you may not have a normal bowel movement for a few days.  Please Note:  You might notice some irritation and congestion in your nose or some drainage.  This is from the oxygen used during your procedure.  There is no need for concern and it should clear up in a day or so.  SYMPTOMS TO REPORT IMMEDIATELY:   Following lower endoscopy (colonoscopy or flexible sigmoidoscopy):  Excessive amounts of blood in the stool  Significant tenderness or worsening of abdominal pains  Swelling of the abdomen that is new, acute  Fever of 100F or higher   For urgent or emergent issues, a gastroenterologist can be reached at any hour by calling [phone].   DIET:  We do recommend a small meal at first, but then you may proceed to your regular diet.  Drink plenty of fluids but you should avoid alcoholic beverages for 24 hours.  ACTIVITY:  You should plan to take it easy for the rest of today and you should NOT DRIVE or use heavy machinery until tomorrow (because of the sedation medicines used during the test).     FOLLOW UP: Our staff will call the number listed on your records the next business day following your procedure to check on you and address any questions or concerns that you may have regarding the information given to you following your procedure. If we do not reach you, we will leave a message.  However, if you are feeling well and you are not experiencing any problems, there is no need to return our call.  We will assume that you have returned to your regular daily activities without incident.  If any biopsies were taken you will be contacted by phone or by letter within the next 1-3 weeks.  Please call us at [phone] if you have not heard about the biopsies in 3 weeks.    SIGNATURES/CONFIDENTIALITY: You and/or your care partner have signed paperwork which will be entered into your electronic medical record.  These signatures attest to the fact that that the information above on your After Visit Summary has been reviewed and is understood.  Full responsibility of the confidentiality of this discharge information lies with you and/or your care-partner.    Handouts were given to your care partner on hemorrhoids, diverticulosis, and a high fiber diet with liberal fluid intake. You may resume your current medications today. Repeat colonoscopy in 10 years for screening purposes. Please call if any questions or concerns.

## 2016-12-12 NOTE — Progress Notes (Signed)
Report to PACU, RN, vss, BBS= Clear.  

## 2016-12-13 ENCOUNTER — Other Ambulatory Visit: Payer: Self-pay

## 2016-12-13 ENCOUNTER — Telehealth: Payer: Self-pay | Admitting: *Deleted

## 2016-12-13 MED ORDER — LEVOTHYROXINE SODIUM 125 MCG PO TABS: 125.0000 ug | ORAL_TABLET | Freq: Every day | ORAL | 1 refills | Status: DC

## 2016-12-13 NOTE — Telephone Encounter (Signed)
Left message on f/u call 

## 2017-01-19 ENCOUNTER — Other Ambulatory Visit: Payer: Self-pay

## 2017-01-19 MED ORDER — DICLOFENAC SODIUM 75 MG PO TBEC: 75.0000 mg | DELAYED_RELEASE_TABLET | Freq: Two times a day (BID) | ORAL | 1 refills | Status: DC

## 2017-04-19 ENCOUNTER — Other Ambulatory Visit: Payer: Self-pay

## 2017-04-19 ENCOUNTER — Other Ambulatory Visit (INDEPENDENT_AMBULATORY_CARE_PROVIDER_SITE_OTHER): Payer: Federal, State, Local not specified - PPO

## 2017-04-19 DIAGNOSIS — Z0000 Encounter for general adult medical examination without abnormal findings: Secondary | ICD-10-CM

## 2017-04-19 LAB — BASIC METABOLIC PANEL
BUN: 12 mg/dL (ref 6–23)
CALCIUM: 10.1 mg/dL (ref 8.4–10.5)
CHLORIDE: 98 meq/L (ref 96–112)
CO2: 30 meq/L (ref 19–32)
CREATININE: 0.86 mg/dL (ref 0.40–1.50)
GFR: 95.37 mL/min (ref >60.00)
GLUCOSE: 111 mg/dL — AB (ref 70–99)
POTASSIUM: 4.4 meq/L (ref 3.5–5.1)
SODIUM: 135 meq/L (ref 135–145)

## 2017-04-19 LAB — LIPID PANEL
CHOL/HDL RATIO: 4
Cholesterol: 237 mg/dL — ABNORMAL HIGH (ref 0–200)
HDL: 54.4 mg/dL (ref >39.00)
LDL Cholesterol: 158 mg/dL — ABNORMAL HIGH (ref 0–99)
NONHDL: 182.42
TRIGLYCERIDES: 120 mg/dL (ref 0.0–149.0)
VLDL: 24 mg/dL (ref 0.0–40.0)

## 2017-04-19 LAB — HEPATIC FUNCTION PANEL
ALK PHOS: 73 U/L (ref 39–117)
ALT: 12 U/L (ref 0–53)
AST: 15 U/L (ref 0–37)
Albumin: 4.7 g/dL (ref 3.5–5.2)
BILIRUBIN DIRECT: 0.1 mg/dL (ref 0.0–0.3)
BILIRUBIN TOTAL: 0.6 mg/dL (ref 0.2–1.2)
Total Protein: 7.5 g/dL (ref 6.0–8.3)

## 2017-04-19 LAB — URINALYSIS, ROUTINE W REFLEX MICROSCOPIC
Bilirubin Urine: NEGATIVE
Hgb urine dipstick: NEGATIVE
Ketones, ur: NEGATIVE
Leukocytes, UA: NEGATIVE
Nitrite: NEGATIVE
PH: 6 (ref 5.0–8.0)
RBC / HPF: NONE SEEN (ref 0–?)
Total Protein, Urine: NEGATIVE
Urine Glucose: NEGATIVE
Urobilinogen, UA: 0.2 (ref 0.0–1.0)

## 2017-04-19 LAB — CBC WITH DIFFERENTIAL/PLATELET
BASOS PCT: 1.3 % (ref 0.0–3.0)
Basophils Absolute: 0.1 10*3/uL (ref 0.0–0.1)
EOS PCT: 9.1 % — AB (ref 0.0–5.0)
Eosinophils Absolute: 0.7 10*3/uL (ref 0.0–0.7)
HEMATOCRIT: 49.6 % (ref 39.0–52.0)
Hemoglobin: 16.8 g/dL (ref 13.0–17.0)
LYMPHS ABS: 2.2 10*3/uL (ref 0.7–4.0)
Lymphocytes Relative: 30.5 % (ref 12.0–46.0)
MCHC: 33.9 g/dL (ref 30.0–36.0)
MCV: 90.6 fl (ref 78.0–100.0)
MONOS PCT: 8.9 % (ref 3.0–12.0)
Monocytes Absolute: 0.7 10*3/uL (ref 0.1–1.0)
NEUTROS ABS: 3.7 10*3/uL (ref 1.4–7.7)
NEUTROS PCT: 50.2 % (ref 43.0–77.0)
PLATELETS: 326 10*3/uL (ref 150.0–400.0)
RBC: 5.47 Mil/uL (ref 4.22–5.81)
RDW: 14.9 % (ref 11.5–15.5)
WBC: 7.4 10*3/uL (ref 4.0–10.5)

## 2017-04-19 LAB — PSA: PSA: 2.02 ng/mL (ref 0.10–4.00)

## 2017-04-19 LAB — TSH: TSH: 1.34 u[IU]/mL (ref 0.35–4.50)

## 2017-04-20 ENCOUNTER — Ambulatory Visit (INDEPENDENT_AMBULATORY_CARE_PROVIDER_SITE_OTHER): Payer: Federal, State, Local not specified - PPO | Admitting: Internal Medicine

## 2017-04-20 ENCOUNTER — Encounter: Payer: Self-pay | Admitting: Internal Medicine

## 2017-04-20 VITALS — BP 134/82 | HR 64 | Temp 97.7°F | Ht 66.0 in | Wt 182.0 lb

## 2017-04-20 DIAGNOSIS — E785 Hyperlipidemia, unspecified: Secondary | ICD-10-CM | POA: Diagnosis not present

## 2017-04-20 DIAGNOSIS — Z0000 Encounter for general adult medical examination without abnormal findings: Secondary | ICD-10-CM

## 2017-04-20 DIAGNOSIS — Z23 Encounter for immunization: Secondary | ICD-10-CM

## 2017-04-20 DIAGNOSIS — E034 Atrophy of thyroid (acquired): Secondary | ICD-10-CM | POA: Diagnosis not present

## 2017-04-20 MED ORDER — PITAVASTATIN CALCIUM 2 MG PO TABS: 1.0000 | ORAL_TABLET | Freq: Every day | ORAL | 11 refills | Status: DC

## 2017-04-20 NOTE — Assessment & Plan Note (Signed)
labs

## 2017-04-20 NOTE — Assessment & Plan Note (Signed)
On Rx 

## 2017-04-20 NOTE — Progress Notes (Signed)
Subjective:  Patient ID: Eric Griffith, male    DOB: 09/21/1955  Age: 61 y.o. MRN: [id number]  CC: No chief complaint on file.   HPI Eric Griffith presents for a well exam Psoriasis and arthralgias - Dr Emily FilbertGould  Outpatient Medications Prior to Visit  Medication Sig Dispense Refill  . aspirin EC 81 MG tablet Take 81 mg by mouth daily.    . Cholecalciferol (VITAMIN D3) 2000 UNITS capsule Take 1 capsule (2,000 Units total) by mouth daily. 100 capsule 3  . diclofenac (VOLTAREN) 75 MG EC tablet Take 1 tablet (75 mg total) by mouth 2 (two) times daily. **PT DUE FOR APPT FOR ADDITIONAL REFILLS** 180 tablet 1  . levothyroxine (SYNTHROID, LEVOTHROID) 125 MCG tablet Take 1 tablet (125 mcg total) by mouth daily. 90 tablet 1  . MEGARED OMEGA-3 KRILL OIL 500 MG CAPS Take 1 capsule by mouth every morning. 100 capsule 3  . TALTZ 80 MG/ML SOAJ      Facility-Administered Medications Prior to Visit  Medication Dose Route Frequency Provider Last Rate Last Dose  . 0.9 %  sodium chloride infusion  500 mL Intravenous Continuous Hilarie FredricksonPerry, John N, MD        ROS Review of Systems  Constitutional: Negative for appetite change, fatigue and unexpected weight change.  HENT: Negative for congestion, nosebleeds, sneezing, sore throat and trouble swallowing.   Eyes: Negative for itching and visual disturbance.  Respiratory: Negative for cough.   Cardiovascular: Negative for chest pain, palpitations and leg swelling.  Gastrointestinal: Negative for abdominal distention, blood in stool, diarrhea and nausea.  Genitourinary: Negative for frequency and hematuria.  Musculoskeletal: Negative for back pain, gait problem, joint swelling and neck pain.  Skin: Negative for rash.  Neurological: Negative for dizziness, tremors, speech difficulty and weakness.  Psychiatric/Behavioral: Negative for agitation, dysphoric mood, sleep disturbance and suicidal ideas. The patient is not nervous/anxious.      Objective:  BP 134/82 (BP Location: Left Arm, Patient Position: Sitting, Cuff Size: Large)   Pulse 64   Temp 97.7 F (36.5 C) (Oral)   Ht 5\' 6"  (1.676 m)   Wt 182 lb (82.6 kg)   SpO2 100%   BMI 29.38 kg/m   BP Readings from Last 3 Encounters:  04/20/17 134/82  12/12/16 (!) 145/85  08/18/16 134/84    Wt Readings from Last 3 Encounters:  04/20/17 182 lb (82.6 kg)  12/12/16 187 lb (84.8 kg)  11/28/16 187 lb 3.2 oz (84.9 kg)    Physical Exam  Constitutional: He is oriented to person, place, and time. He appears well-developed and well-nourished. No distress.  HENT:  Head: Normocephalic and atraumatic.  Right Ear: External ear normal.  Left Ear: External ear normal.  Nose: Nose normal.  Mouth/Throat: Oropharynx is clear and moist. No oropharyngeal exudate.  Eyes: Conjunctivae and EOM are normal. Pupils are equal, round, and reactive to light. Right eye exhibits no discharge. Left eye exhibits no discharge. No scleral icterus.  Neck: Normal range of motion. Neck supple. No JVD present. No tracheal deviation present. No thyromegaly present.  Cardiovascular: Normal rate, regular rhythm, normal heart sounds and intact distal pulses. Exam reveals no gallop and no friction rub.  No murmur heard. Pulmonary/Chest: Effort normal and breath sounds normal. No stridor. No respiratory distress. He has no wheezes. He has no rales. He exhibits no tenderness.  Abdominal: Soft. Bowel sounds are normal. He exhibits no distension and no mass. There is no tenderness. There is no rebound and no  guarding.  Genitourinary: Rectum normal, prostate normal and penis normal. Rectal exam shows guaiac negative stool. No penile tenderness.  Musculoskeletal: Normal range of motion. He exhibits no edema or tenderness.  Lymphadenopathy:    He has no cervical adenopathy.  Neurological: He is alert and oriented to person, place, and time. He has normal reflexes. No cranial nerve deficit. He exhibits normal  muscle tone. Coordination normal.  Skin: Skin is warm and dry. No rash noted. He is not diaphoretic. No erythema. No pallor.  Psychiatric: He has a normal mood and affect. His behavior is normal. Judgment and thought content normal.   declined rectal - just had a colonoscopy    Lab Results  Component Value Date   WBC 7.4 04/19/2017   HGB 16.8 04/19/2017   HCT 49.6 04/19/2017   PLT 326.0 04/19/2017   GLUCOSE 111 (H) 04/19/2017   CHOL 237 (H) 04/19/2017   TRIG 120.0 04/19/2017   HDL 54.40 04/19/2017   LDLDIRECT 133.6 01/25/2012   LDLCALC 158 (H) 04/19/2017   ALT 12 04/19/2017   AST 15 04/19/2017   NA 135 04/19/2017   K 4.4 04/19/2017   CL 98 04/19/2017   CREATININE 0.86 04/19/2017   BUN 12 04/19/2017   CO2 30 04/19/2017   TSH 1.34 04/19/2017   PSA 2.02 04/19/2017   HGBA1C 5.6 06/19/2006    Dg Chest 2 View  Result Date: 04/18/2016 CLINICAL DATA:  Routine examination, no current complaints, former smoker, history of hepatitis-C, chronic right bundle branch block, former smoker. EXAM: CHEST  2 VIEW COMPARISON:  None in PACs FINDINGS: The lungs are adequately inflated and clear. The heart and pulmonary vascularity are normal. The mediastinum is normal in width. There is no pleural effusion. 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 IMPRESSION: There is no active cardiopulmonary disease. Electronically Signed   By: David  SwazilandJordan M.D.   On: 04/18/2016 08:58    Assessment & Plan:   There are no diagnoses linked to this encounter. I am having Eric Griffith maintain his Vitamin D3, MEGARED OMEGA-3 KRILL OIL, aspirin EC, levothyroxine, diclofenac, and TALTZ. We will continue to administer sodium chloride.  No orders of the defined types were placed in this encounter.    Follow-up: No Follow-up on file.  Sonda PrimesAlex Ebony Yorio, MD

## 2017-04-20 NOTE — Assessment & Plan Note (Addendum)
We discussed age appropriate health related issues, including available/recomended screening tests and vaccinations. We discussed a need for adhering to healthy diet and exercise. Labs/EKG were reviewed/ordered. All questions were answered.  Cologuard 2015 neg

## 2017-05-02 ENCOUNTER — Encounter: Payer: Self-pay | Admitting: Internal Medicine

## 2017-06-21 ENCOUNTER — Other Ambulatory Visit: Payer: Self-pay

## 2017-06-21 MED ORDER — LEVOTHYROXINE SODIUM 125 MCG PO TABS: 125.0000 ug | ORAL_TABLET | Freq: Every day | ORAL | 1 refills | Status: DC

## 2017-09-12 ENCOUNTER — Telehealth: Payer: Self-pay | Admitting: Internal Medicine

## 2017-09-12 NOTE — Telephone Encounter (Signed)
Last Cologurad: 6/ 4/ 2015 LOV: 11/ 29/ 2018 Next appt: Not Sch'ed Would you like to reorder cologuard?

## 2017-09-13 NOTE — Telephone Encounter (Signed)
Pt had a colonoscopy in 2018. Closing note.

## 2017-10-25 ENCOUNTER — Ambulatory Visit: Payer: Federal, State, Local not specified - PPO | Admitting: Internal Medicine

## 2017-10-31 ENCOUNTER — Encounter: Payer: Self-pay | Admitting: Physician Assistant

## 2017-10-31 ENCOUNTER — Ambulatory Visit: Payer: Federal, State, Local not specified - PPO | Admitting: Physician Assistant

## 2017-10-31 VITALS — BP 118/66 | HR 70 | Ht 66.0 in | Wt 188.0 lb

## 2017-10-31 DIAGNOSIS — B182 Chronic viral hepatitis C: Secondary | ICD-10-CM | POA: Diagnosis not present

## 2017-10-31 DIAGNOSIS — R1310 Dysphagia, unspecified: Secondary | ICD-10-CM | POA: Diagnosis not present

## 2017-10-31 DIAGNOSIS — R1319 Other dysphagia: Secondary | ICD-10-CM

## 2017-10-31 NOTE — Progress Notes (Signed)
Subjective:    Patient ID: Eric Griffith, male    DOB: [birth year], 62 y.o.   MRN: [id number]  HPI Eric Griffith is a pleasant 62 year old white male, known to Dr. Marina Goodell from prior colonoscopies.  He comes in today with new complaint of intermittent solid food dysphasia. Patient has history of hypertension, hypothyroidism, right bundle branch block, psoriasis for which she is on Taltz, and has prior history of hep C which has been eradicated.    He had colonoscopy in July 2018 per Dr. Marina Goodell showing multiple diverticuli small and large mouth, internal hemorrhoids and no polyps. He has not had prior upper endoscopy. He says he has had his current symptoms for at least the past few years and says they are very sporadic and infrequent.  He may have symptoms once a month or less.  He denies any heartburn or indigestion.  He describes episodes of dysphasia, usually onset after he is taken a couple of bites of food when he gets a sensation of food hanging up in his esophagus.  He has to stop eating and wait for the food to go down.  He has had episodes requiring regurgitation, most of the time the food will go down within several minutes. He had a particularly bad episode a couple of weeks ago while he was at [employer].  He had taken a couple of bites of beef, felt to become lodged and was unable to get it cleared for at least an hour.  He said he was very uncomfortable, he tried to regurgitate and was unsuccessful.  He had a buildup of saliva, but then eventually after an hour or so the food went down.  He has not had any recurrent symptoms since. Review of Systems Pertinent positive and negative review of systems were noted in the above HPI section.  All other review of systems was otherwise negative.  Outpatient Encounter Medications as of 10/31/2017  Medication Sig  . aspirin EC 81 MG tablet Take 81 mg by mouth daily.  . Cholecalciferol (VITAMIN D3) 2000 UNITS capsule Take 1 capsule (2,000 Units  total) by mouth daily.  [name] levothyroxine (SYNTHROID, LEVOTHROID) 125 MCG tablet Take 1 tablet (125 mcg total) by mouth daily.  [name] TALTZ 80 MG/ML SOAJ   . [DISCONTINUED] MEGARED OMEGA-3 KRILL OIL 500 MG CAPS Take 1 capsule by mouth every morning.  . [DISCONTINUED] Pitavastatin Calcium 2 MG TABS Take 1 tablet (2 mg total) by mouth daily.   Facility-Administered Encounter Medications as of 10/31/2017  Medication  . 0.9 %  sodium chloride infusion   Allergies  Allergen Reactions  . Lovastatin     REACTION: aches   Patient Active Problem List   Diagnosis Date Noted  . Facial cellulitis 08/18/2016  . Cough 04/18/2016  . Abscess, groin 01/15/2015  . Colon cancer screening 10/23/2013  . Acute sinusitis 12/20/2012  . Right shoulder pain 01/25/2012  . Well adult exam 05/26/2011  . Elevated BP without diagnosis of hypertension 05/26/2011  . BPH (benign prostatic hyperplasia) 05/26/2011  . CERUMEN IMPACTION 03/20/2009  . TOBACCO USE, QUIT 03/20/2009  . INSOMNIA, PERSISTENT 06/12/2008  . BUNDLE BRANCH BLOCK, RIGHT 08/15/2007  . Hypothyroidism 04/23/2007  . Dyslipidemia 04/23/2007  . PSORIATIC ARTHRITIS 04/23/2007  . Chronic hepatitis C (HCC) 02/12/2007  . ERECTILE DYSFUNCTION 02/12/2007  . PSORIASIS 02/12/2007  . ARTHRITIS W/VIRAL DISEASE, OTHER [facility] SITE 02/12/2007   Social History   Socioeconomic History  . Marital status: Married    Spouse name: Not  on file  . Number of children: Not on file  . Years of education: Not on file  . Highest education level: Not on file  Occupational History  . Occupation: Event organiserilot    Employer: [employer]  Social Needs  . Financial resource strain: Not on file  . Food insecurity:    Worry: Not on file    Inability: Not on file  . Transportation needs:    Medical: Not on file    Non-medical: Not on file  Tobacco Use  . Smoking status: Former Games developermoker  . Smokeless tobacco: Never Used  Substance and Sexual Activity  . Alcohol use: Yes    Comment:  occasionally   . Drug use: No  . Sexual activity: Yes  Lifestyle  . Physical activity:    Days per week: Not on file    Minutes per session: Not on file  . Stress: Not on file  Relationships  . Social connections:    Talks on phone: Not on file    Gets together: Not on file    Attends religious service: Not on file    Active member of club or organization: Not on file    Attends meetings of clubs or organizations: Not on file    Relationship status: Not on file  . Intimate partner violence:    Fear of current or ex partner: Not on file    Emotionally abused: Not on file    Physically abused: Not on file    Forced sexual activity: Not on file  Other Topics Concern  . Not on file  Social History Narrative  . Not on file    Eric Griffith's family history includes Cancer in his father and mother.      Objective:    Vitals:   10/31/17 0919  BP: 118/66  Pulse: 70    Physical Exam; well-developed older white male in no acute distress, pleasant blood pressure 118/66, pulse 70, height 5 foot 6, weight 188, BMI of 30.3.  HEENT ;nontraumatic normocephalic EOMI PERRLA sclera anicteric, Oropharynx benign, Cardiovascular ;regular rate and rhythm with S1-S2, Pulmonary ;clear bilaterally, Abdomen ;soft nontender nondistended bowel sounds are active no palpable mass or hepatosplenomegaly, Rectal ;exam not done, Extremities; no clubbing cyanosis or edema skin warm dry, Neuro psych ;mood and affect appropriate, alert and oriented grossly nonfocal       Assessment & Plan:   #281 62 year old white male with intermittent solid food dysphasia with episodes occurring generally less than once per month but frequently requiring regurgitation or prolonged amount of time for food gradually passed. No associated heartburn or indigestion.  Rule out esophageal stricture versus esophageal ring versus less likely motility disorder  #2 colon cancer surveillance-up-to-date with no polyps found on  colonoscopy July 2018 and indicated for 10-year interval follow-up  #3 hypertension #4.  Hypothyroidism #5.  Psoriasis #6.  Right bundle branch block   #7 history of hepatitis C eradicated #8  Diverticulosis  Plan; Options were discussed with the patient including barium swallow versus upper endoscopy with possible esophageal dilation.  I reviewed indications risks and benefits of the EGD with probable dilation. Patient is agreeable to proceed.  EGD with probable dilation will be scheduled with Dr. Yancey FlemingsJohn Perry. In the interim patient was encouraged to chew his food very carefully and cut food especially meats into very small pieces   Amy S Esterwood PA-C 10/31/2017   Cc: Plotnikov, Georgina QuintAleksei V, MD

## 2017-10-31 NOTE — Progress Notes (Signed)
Assessment and plans reviewed  

## 2017-10-31 NOTE — Patient Instructions (Signed)

## 2017-11-07 ENCOUNTER — Encounter

## 2017-11-07 ENCOUNTER — Ambulatory Visit: Payer: Federal, State, Local not specified - PPO | Admitting: Internal Medicine

## 2017-12-13 ENCOUNTER — Encounter: Payer: Federal, State, Local not specified - PPO | Admitting: Internal Medicine

## 2017-12-20 ENCOUNTER — Other Ambulatory Visit: Payer: Self-pay

## 2017-12-20 MED ORDER — LEVOTHYROXINE SODIUM 125 MCG PO TABS: 125.0000 ug | ORAL_TABLET | Freq: Every day | ORAL | 1 refills | Status: DC

## 2017-12-25 ENCOUNTER — Other Ambulatory Visit: Payer: Self-pay | Admitting: *Deleted

## 2017-12-25 ENCOUNTER — Telehealth: Payer: Self-pay | Admitting: Internal Medicine

## 2017-12-25 MED ORDER — LEVOTHYROXINE SODIUM 125 MCG PO TABS: 125.0000 ug | ORAL_TABLET | Freq: Every day | ORAL | 1 refills | Status: DC

## 2017-12-25 NOTE — Telephone Encounter (Unsigned)
Copied from CRM [phone]. Topic: General - Other >> Dec 25, 2017  9:06 AM Percival SpanishKennedy, Cheryl W wrote:  Pt RX was [facility] [place] he has moved. Need the RX sent to [facility] [place] the pharmacy has been updated

## 2017-12-25 NOTE — Telephone Encounter (Signed)
Rx forwarded to new pharmacy as requested.

## 2018-02-07 ENCOUNTER — Encounter: Payer: Self-pay | Admitting: Internal Medicine

## 2018-02-07 ENCOUNTER — Ambulatory Visit: Payer: Federal, State, Local not specified - PPO | Admitting: Internal Medicine

## 2018-02-07 VITALS — BP 134/80 | HR 65 | Temp 98.7°F | Ht 66.0 in | Wt 184.0 lb

## 2018-02-07 DIAGNOSIS — B372 Candidiasis of skin and nail: Secondary | ICD-10-CM

## 2018-02-07 DIAGNOSIS — E034 Atrophy of thyroid (acquired): Secondary | ICD-10-CM | POA: Diagnosis not present

## 2018-02-07 DIAGNOSIS — Z23 Encounter for immunization: Secondary | ICD-10-CM | POA: Diagnosis not present

## 2018-02-07 DIAGNOSIS — B029 Zoster without complications: Secondary | ICD-10-CM | POA: Diagnosis not present

## 2018-02-07 DIAGNOSIS — L304 Erythema intertrigo: Secondary | ICD-10-CM | POA: Insufficient documentation

## 2018-02-07 MED ORDER — KETOCONAZOLE 2 % EX CREA: 1.0000 "application " | TOPICAL_CREAM | Freq: Every day | CUTANEOUS | 1 refills | Status: DC

## 2018-02-07 MED ORDER — VALACYCLOVIR HCL 1 G PO TABS: 1000.0000 mg | ORAL_TABLET | Freq: Three times a day (TID) | ORAL | 1 refills | Status: DC

## 2018-02-07 NOTE — Assessment & Plan Note (Signed)
Levothroid 

## 2018-02-07 NOTE — Assessment & Plan Note (Signed)
Valtrex Shingrix in 6 months

## 2018-02-07 NOTE — Patient Instructions (Signed)
Get the chest X ray report please

## 2018-02-07 NOTE — Assessment & Plan Note (Signed)
ketoconazole cream Athletic underwear

## 2018-02-07 NOTE — Progress Notes (Signed)
Subjective:  Patient ID: Eric Griffith, male    DOB: 31-Mar-1956  Age: 62 y.o. MRN: [id number]  CC: No chief complaint on file.   HPI Miko Sirico presents for C/o pain in the L arm x a few weeks. [place] an abx - intolerant. He did not get any antiviral Rx There was a spot - ?scar on CXR  Outpatient Medications Prior to Visit  Medication Sig Dispense Refill  . aspirin EC 81 MG tablet Take 81 mg by mouth daily.    . Cholecalciferol (VITAMIN D3) 2000 UNITS capsule Take 1 capsule (2,000 Units total) by mouth daily. 100 capsule 3  . levothyroxine (SYNTHROID, LEVOTHROID) 125 MCG tablet Take 1 tablet (125 mcg total) by mouth daily. 90 tablet 1  . TALTZ 80 MG/ML SOAJ      No facility-administered medications prior to visit.     ROS: Review of Systems  Constitutional: Negative for appetite change, fatigue and unexpected weight change.  HENT: Negative for congestion, nosebleeds, sneezing, sore throat and trouble swallowing.   Eyes: Negative for itching and visual disturbance.  Respiratory: Negative for cough.   Cardiovascular: Negative for chest pain, palpitations and leg swelling.  Gastrointestinal: Negative for abdominal distention, blood in stool, diarrhea and nausea.  Genitourinary: Negative for frequency and hematuria.  Musculoskeletal: Negative for back pain, gait problem, joint swelling and neck pain.  Skin: Negative for rash.  Neurological: Negative for dizziness, tremors, speech difficulty and weakness.  Psychiatric/Behavioral: Negative for agitation, dysphoric mood and sleep disturbance. The patient is not nervous/anxious.     Objective:  BP 134/80 (BP Location: Left Arm, Patient Position: Sitting, Cuff Size: Normal)   Pulse 65   Temp 98.7 F (37.1 C) (Oral)   Ht 5\' 6"  (1.676 m)   Wt 184 lb (83.5 kg)   SpO2 97%   BMI 29.70 kg/m   BP Readings from Last 3 Encounters:  02/07/18 134/80  10/31/17  118/66  04/20/17 134/82    Wt Readings from Last 3 Encounters:  02/07/18 184 lb (83.5 kg)  10/31/17 188 lb (85.3 kg)  04/20/17 182 lb (82.6 kg)    Physical Exam  Constitutional: He is oriented to person, place, and time. He appears well-developed. No distress.  NAD  HENT:  Mouth/Throat: Oropharynx is clear and moist.  Eyes: Pupils are equal, round, and reactive to light. Conjunctivae are normal.  Neck: Normal range of motion. No JVD present. No thyromegaly present.  Cardiovascular: Normal rate, regular rhythm, normal heart sounds and intact distal pulses. Exam reveals no gallop and no friction rub.  No murmur heard. Pulmonary/Chest: Effort normal and breath sounds normal. No respiratory distress. He has no wheezes. He has no rales. He exhibits no tenderness.  Abdominal: Soft. Bowel sounds are normal. He exhibits no distension and no mass. There is no tenderness. There is no rebound and no guarding.  Musculoskeletal: Normal range of motion. He exhibits no edema or tenderness.  Lymphadenopathy:    He has no cervical adenopathy.  Neurological: He is alert and oriented to person, place, and time. He has normal reflexes. No cranial nerve deficit. He exhibits normal muscle tone. He displays a negative Romberg sign. Coordination and gait normal.  Skin: Skin is warm and dry. Rash noted.  Psychiatric: He has a normal mood and affect. His behavior is normal. Judgment and thought content normal.   Rash - healing on the L post neck and  L arm  Intertrigo   Lab Results  Component Value Date   WBC 7.4 04/19/2017   HGB 16.8 04/19/2017   HCT 49.6 04/19/2017   PLT 326.0 04/19/2017   GLUCOSE 111 (H) 04/19/2017   CHOL 237 (H) 04/19/2017   TRIG 120.0 04/19/2017   HDL 54.40 04/19/2017   LDLDIRECT 133.6 01/25/2012   LDLCALC 158 (H) 04/19/2017   ALT 12 04/19/2017   AST 15 04/19/2017   NA 135 04/19/2017   K 4.4 04/19/2017   CL 98 04/19/2017   CREATININE 0.86 04/19/2017   BUN 12 04/19/2017    CO2 30 04/19/2017   TSH 1.34 04/19/2017   PSA 2.02 04/19/2017   HGBA1C 5.6 06/19/2006    Dg Chest 2 View  Result Date: 04/18/2016 CLINICAL DATA:  Routine examination, no current complaints, former smoker, history of hepatitis-C, chronic right bundle branch block, former smoker. EXAM: CHEST  2 VIEW COMPARISON:  None in PACs FINDINGS: The lungs are adequately inflated and clear. The heart and pulmonary vascularity are normal. The mediastinum is normal in width. There is no pleural effusion. 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 IMPRESSION: There is no active cardiopulmonary disease. Electronically Signed   By: David  SwazilandJordan M.D.   On: 04/18/2016 08:58    Assessment & Plan:   There are no diagnoses linked to this encounter.   No orders of the defined types were placed in this encounter.    Follow-up: No follow-ups on file.  Sonda PrimesAlex Plotnikov, MD

## 2018-07-04 ENCOUNTER — Other Ambulatory Visit (INDEPENDENT_AMBULATORY_CARE_PROVIDER_SITE_OTHER): Payer: Federal, State, Local not specified - PPO

## 2018-07-04 ENCOUNTER — Other Ambulatory Visit: Payer: Self-pay

## 2018-07-04 DIAGNOSIS — Z0000 Encounter for general adult medical examination without abnormal findings: Secondary | ICD-10-CM

## 2018-07-04 LAB — URINALYSIS
Bilirubin Urine: NEGATIVE
Hgb urine dipstick: NEGATIVE
Ketones, ur: NEGATIVE
Leukocytes,Ua: NEGATIVE
Nitrite: NEGATIVE
PH: 5 (ref 5.0–8.0)
Specific Gravity, Urine: <=1.005 — AB (ref 1.000–1.030)
Total Protein, Urine: NEGATIVE
URINE GLUCOSE: NEGATIVE
Urobilinogen, UA: 0.2 (ref 0.0–1.0)

## 2018-07-04 LAB — CBC WITH DIFFERENTIAL/PLATELET
BASOS ABS: 0.1 10*3/uL (ref 0.0–0.1)
Basophils Relative: 1 % (ref 0.0–3.0)
Eosinophils Absolute: 0.6 10*3/uL (ref 0.0–0.7)
Eosinophils Relative: 6.2 % — ABNORMAL HIGH (ref 0.0–5.0)
HEMATOCRIT: 49.5 % (ref 39.0–52.0)
Hemoglobin: 17 g/dL (ref 13.0–17.0)
LYMPHS PCT: 23.5 % (ref 12.0–46.0)
Lymphs Abs: 2.4 10*3/uL (ref 0.7–4.0)
MCHC: 34.4 g/dL (ref 30.0–36.0)
MCV: 90.1 fl (ref 78.0–100.0)
Monocytes Absolute: 0.9 10*3/uL (ref 0.1–1.0)
Monocytes Relative: 8.5 % (ref 3.0–12.0)
NEUTROS ABS: 6.2 10*3/uL (ref 1.4–7.7)
Neutrophils Relative %: 60.8 % (ref 43.0–77.0)
Platelets: 328 10*3/uL (ref 150.0–400.0)
RBC: 5.49 Mil/uL (ref 4.22–5.81)
RDW: 13.7 % (ref 11.5–15.5)
WBC: 10.3 10*3/uL (ref 4.0–10.5)

## 2018-07-04 LAB — LIPID PANEL
Cholesterol: 222 mg/dL — ABNORMAL HIGH (ref 0–200)
HDL: 52.9 mg/dL (ref >39.00)
LDL Cholesterol: 149 mg/dL — ABNORMAL HIGH (ref 0–99)
NonHDL: 168.98
Total CHOL/HDL Ratio: 4
Triglycerides: 102 mg/dL (ref 0.0–149.0)
VLDL: 20.4 mg/dL (ref 0.0–40.0)

## 2018-07-04 LAB — BASIC METABOLIC PANEL
BUN: 12 mg/dL (ref 6–23)
CO2: 29 mEq/L (ref 19–32)
Calcium: 10 mg/dL (ref 8.4–10.5)
Chloride: 98 mEq/L (ref 96–112)
Creatinine, Ser: 0.73 mg/dL (ref 0.40–1.50)
GFR: 108 mL/min (ref >60.00)
Glucose, Bld: 83 mg/dL (ref 70–99)
Potassium: 3.9 mEq/L (ref 3.5–5.1)
Sodium: 136 mEq/L (ref 135–145)

## 2018-07-04 LAB — HEPATIC FUNCTION PANEL
ALT: 13 U/L (ref 0–53)
AST: 15 U/L (ref 0–37)
Albumin: 4.8 g/dL (ref 3.5–5.2)
Alkaline Phosphatase: 70 U/L (ref 39–117)
Bilirubin, Direct: 0.1 mg/dL (ref 0.0–0.3)
Total Bilirubin: 0.7 mg/dL (ref 0.2–1.2)
Total Protein: 7.7 g/dL (ref 6.0–8.3)

## 2018-07-04 LAB — TSH: TSH: 0.35 u[IU]/mL (ref 0.35–4.50)

## 2018-07-04 LAB — PSA: PSA: 2.21 ng/mL (ref 0.10–4.00)

## 2018-07-05 ENCOUNTER — Encounter: Payer: Self-pay | Admitting: Internal Medicine

## 2018-07-05 ENCOUNTER — Other Ambulatory Visit: Payer: Self-pay | Admitting: Internal Medicine

## 2018-07-05 ENCOUNTER — Ambulatory Visit (INDEPENDENT_AMBULATORY_CARE_PROVIDER_SITE_OTHER): Payer: Federal, State, Local not specified - PPO | Admitting: Internal Medicine

## 2018-07-05 VITALS — BP 136/84 | HR 72 | Temp 98.4°F | Ht 66.0 in | Wt 191.0 lb

## 2018-07-05 DIAGNOSIS — M25562 Pain in left knee: Secondary | ICD-10-CM | POA: Diagnosis not present

## 2018-07-05 DIAGNOSIS — Z0000 Encounter for general adult medical examination without abnormal findings: Secondary | ICD-10-CM

## 2018-07-05 DIAGNOSIS — E034 Atrophy of thyroid (acquired): Secondary | ICD-10-CM | POA: Diagnosis not present

## 2018-07-05 DIAGNOSIS — G8929 Other chronic pain: Secondary | ICD-10-CM | POA: Diagnosis not present

## 2018-07-05 MED ORDER — MELOXICAM 15 MG PO TABS: 15.0000 mg | ORAL_TABLET | Freq: Every day | ORAL | 0 refills | Status: DC

## 2018-07-05 NOTE — Progress Notes (Signed)
Subjective:  Patient ID: Eric Griffith, male    DOB: 1956-01-14  Age: 63 y.o. MRN: [id number]  CC: No chief complaint on file.   HPI Eric Griffith presents for a well exam C/o chronic L knee pain x weeks, worse lately.  Eric Griffith has moved to [place].  He does a lot of walking on uneven train.  Walking seems to aggravate his knee pain.  No injury.  The pain is worse when driving and with walking down the steps.  Outpatient Medications Prior to Visit  Medication Sig Dispense Refill  . Cholecalciferol (VITAMIN D3) 2000 UNITS capsule Take 1 capsule (2,000 Units total) by mouth daily. 100 capsule 3  . levothyroxine (SYNTHROID, LEVOTHROID) 125 MCG tablet Take 1 tablet (125 mcg total) by mouth daily. 90 tablet 1  . TALTZ 80 MG/ML SOAJ     . aspirin EC 81 MG tablet Take 81 mg by mouth daily.    [name] ketoconazole (NIZORAL) 2 % cream Apply 1 application topically daily. 45 g 1  . valACYclovir (VALTREX) 1000 MG tablet Take 1 tablet (1,000 mg total) by mouth 3 (three) times daily. 21 tablet 1   No facility-administered medications prior to visit.     ROS: Review of Systems  Constitutional: Negative for appetite change, fatigue and unexpected weight change.  HENT: Negative for congestion, nosebleeds, sneezing, sore throat and trouble swallowing.   Eyes: Negative for itching and visual disturbance.  Respiratory: Negative for cough.   Cardiovascular: Negative for chest pain, palpitations and leg swelling.  Gastrointestinal: Negative for abdominal distention, blood in stool, diarrhea and nausea.  Genitourinary: Negative for frequency and hematuria.  Musculoskeletal: Positive for arthralgias and gait problem. Negative for back pain, joint swelling and neck pain.  Skin: Negative for rash.  Neurological: Negative for dizziness, tremors, speech difficulty and weakness.  Psychiatric/Behavioral: Negative for agitation, dysphoric mood and sleep disturbance. The patient is not  nervous/anxious.     Objective:  BP 136/84 (BP Location: Left Arm, Patient Position: Sitting, Cuff Size: Normal)   Pulse 72   Temp 98.4 F (36.9 C) (Oral)   Ht 5\' 6"  (1.676 m)   Wt 191 lb (86.6 kg)   SpO2 96%   BMI 30.83 kg/m   BP Readings from Last 3 Encounters:  07/05/18 136/84  02/07/18 134/80  10/31/17 118/66    Wt Readings from Last 3 Encounters:  07/05/18 191 lb (86.6 kg)  02/07/18 184 lb (83.5 kg)  10/31/17 188 lb (85.3 kg)    Physical Exam Constitutional:      General: He is not in acute distress.    Appearance: He is well-developed.     Comments: NAD  Eyes:     Conjunctiva/sclera: Conjunctivae normal.     Pupils: Pupils are equal, round, and reactive to light.  Neck:     Musculoskeletal: Normal range of motion.     Thyroid: No thyromegaly.     Vascular: No JVD.  Cardiovascular:     Rate and Rhythm: Normal rate and regular rhythm.     Heart sounds: Normal heart sounds. No murmur. No friction rub. No gallop.   Pulmonary:     Effort: Pulmonary effort is normal. No respiratory distress.     Breath sounds: Normal breath sounds. No wheezing or rales.  Chest:     Chest wall: No tenderness.  Abdominal:     General: Bowel sounds are normal. There is no distension.     Palpations: Abdomen is soft. There is no  mass.     Tenderness: There is no abdominal tenderness. There is no guarding or rebound.  Genitourinary:    Prostate: Normal.     Rectum: Normal. Guaiac result negative.  Musculoskeletal: Normal range of motion.        General: No tenderness.  Lymphadenopathy:     Cervical: No cervical adenopathy.  Skin:    General: Skin is warm and dry.     Findings: No rash.  Neurological:     Mental Status: He is alert and oriented to person, place, and time.     Cranial Nerves: No cranial nerve deficit.     Motor: No abnormal muscle tone.     Coordination: Coordination normal.     Gait: Gait normal.     Deep Tendon Reflexes: Reflexes are normal and symmetric.    Psychiatric:        Behavior: Behavior normal.        Thought Content: Thought content normal.        Judgment: Judgment normal.   L knee w/pain on range of motion.  No effusion.  Lab Results  Component Value Date   WBC 10.3 07/04/2018   HGB 17.0 07/04/2018   HCT 49.5 07/04/2018   PLT 328.0 07/04/2018   GLUCOSE 83 07/04/2018   CHOL 222 (H) 07/04/2018   TRIG 102.0 07/04/2018   HDL 52.90 07/04/2018   LDLDIRECT 133.6 01/25/2012   LDLCALC 149 (H) 07/04/2018   ALT 13 07/04/2018   AST 15 07/04/2018   NA 136 07/04/2018   K 3.9 07/04/2018   CL 98 07/04/2018   CREATININE 0.73 07/04/2018   BUN 12 07/04/2018   CO2 29 07/04/2018   TSH 0.35 07/04/2018   PSA 2.21 07/04/2018   HGBA1C 5.6 06/19/2006    Dg Chest 2 View  Result Date: 04/18/2016 CLINICAL DATA:  Routine examination, no current complaints, former smoker, history of hepatitis-C, chronic right bundle branch block, former smoker. EXAM: CHEST  2 VIEW COMPARISON:  None in PACs FINDINGS: The lungs are adequately inflated and clear. The heart and pulmonary vascularity are normal. The mediastinum is normal in width. There is no pleural effusion. 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 IMPRESSION: There is no active cardiopulmonary disease. Electronically Signed   By: David  [place] M.D.   On: 04/18/2016 08:58    Assessment & Plan:   There are no diagnoses linked to this encounter.   No orders of the defined types were placed in this encounter.    Follow-up: No follow-ups on file.  Sonda Primes, MD

## 2018-07-05 NOTE — Assessment & Plan Note (Addendum)
?  meniscal tear Meloxicam 1 daily with food [place] on uneven surfaces, rest.  Elastic sleeve.

## 2018-07-05 NOTE — Assessment & Plan Note (Signed)
We discussed age appropriate health related issues, including available/recomended screening tests and vaccinations. We discussed a need for adhering to healthy diet and exercise. Labs were ordered to be later reviewed . All questions were answered.  Cologuard 2015 neg Colon 2018 Dr Marina Goodell, due in 2028 A cardiac CT scan for calcium scoring offered 2/20

## 2018-07-05 NOTE — Patient Instructions (Signed)

## 2018-07-08 NOTE — Assessment & Plan Note (Signed)
Continue with Levothroid.  Lab work.

## 2018-08-07 ENCOUNTER — Other Ambulatory Visit: Payer: Self-pay | Admitting: Internal Medicine

## 2018-09-19 ENCOUNTER — Other Ambulatory Visit: Payer: Self-pay | Admitting: Internal Medicine

## 2019-03-14 ENCOUNTER — Other Ambulatory Visit: Payer: Self-pay | Admitting: Internal Medicine

## 2019-08-02 ENCOUNTER — Telehealth: Payer: Self-pay

## 2019-08-02 DIAGNOSIS — Z0000 Encounter for general adult medical examination without abnormal findings: Secondary | ICD-10-CM

## 2019-08-02 NOTE — Telephone Encounter (Signed)
Labs ordered.

## 2019-08-02 NOTE — Telephone Encounter (Signed)
New message    The patient call would like to have lab work before appt on  3.18.21

## 2019-08-08 ENCOUNTER — Other Ambulatory Visit (INDEPENDENT_AMBULATORY_CARE_PROVIDER_SITE_OTHER): Payer: Medicare Other

## 2019-08-08 ENCOUNTER — Encounter: Payer: Self-pay | Admitting: Internal Medicine

## 2019-08-08 ENCOUNTER — Ambulatory Visit (INDEPENDENT_AMBULATORY_CARE_PROVIDER_SITE_OTHER): Payer: Medicare Other | Admitting: Internal Medicine

## 2019-08-08 ENCOUNTER — Other Ambulatory Visit: Payer: Self-pay

## 2019-08-08 VITALS — BP 138/84 | HR 58 | Temp 98.1°F | Ht 66.0 in | Wt 185.0 lb

## 2019-08-08 DIAGNOSIS — Z0000 Encounter for general adult medical examination without abnormal findings: Secondary | ICD-10-CM

## 2019-08-08 DIAGNOSIS — Z125 Encounter for screening for malignant neoplasm of prostate: Secondary | ICD-10-CM

## 2019-08-08 DIAGNOSIS — E034 Atrophy of thyroid (acquired): Secondary | ICD-10-CM

## 2019-08-08 DIAGNOSIS — E785 Hyperlipidemia, unspecified: Secondary | ICD-10-CM

## 2019-08-08 LAB — CBC WITH DIFFERENTIAL/PLATELET
Basophils Absolute: 0.1 10*3/uL (ref 0.0–0.1)
Basophils Relative: 1.4 % (ref 0.0–3.0)
Eosinophils Absolute: 0.5 10*3/uL (ref 0.0–0.7)
Eosinophils Relative: 7.8 % — ABNORMAL HIGH (ref 0.0–5.0)
HCT: 47.4 % (ref 39.0–52.0)
Hemoglobin: 16 g/dL (ref 13.0–17.0)
Lymphocytes Relative: 39.4 % (ref 12.0–46.0)
Lymphs Abs: 2.5 10*3/uL (ref 0.7–4.0)
MCHC: 33.8 g/dL (ref 30.0–36.0)
MCV: 91.3 fl (ref 78.0–100.0)
Monocytes Absolute: 0.7 10*3/uL (ref 0.1–1.0)
Monocytes Relative: 10.7 % (ref 3.0–12.0)
Neutro Abs: 2.6 10*3/uL (ref 1.4–7.7)
Neutrophils Relative %: 40.7 % — ABNORMAL LOW (ref 43.0–77.0)
Platelets: 290 10*3/uL (ref 150.0–400.0)
RBC: 5.19 Mil/uL (ref 4.22–5.81)
RDW: 13.5 % (ref 11.5–15.5)
WBC: 6.3 10*3/uL (ref 4.0–10.5)

## 2019-08-08 LAB — URINALYSIS
Bilirubin Urine: NEGATIVE
Hgb urine dipstick: NEGATIVE
Ketones, ur: NEGATIVE
Leukocytes,Ua: NEGATIVE
Nitrite: NEGATIVE
Specific Gravity, Urine: <=1.005 — AB (ref 1.000–1.030)
Total Protein, Urine: NEGATIVE
Urine Glucose: NEGATIVE
Urobilinogen, UA: 0.2 (ref 0.0–1.0)
pH: 6 (ref 5.0–8.0)

## 2019-08-08 LAB — BASIC METABOLIC PANEL
BUN: 9 mg/dL (ref 6–23)
CO2: 31 mEq/L (ref 19–32)
Calcium: 9.8 mg/dL (ref 8.4–10.5)
Chloride: 99 mEq/L (ref 96–112)
Creatinine, Ser: 0.72 mg/dL (ref 0.40–1.50)
GFR: 109.36 mL/min (ref >60.00)
Glucose, Bld: 108 mg/dL — ABNORMAL HIGH (ref 70–99)
Potassium: 4.1 mEq/L (ref 3.5–5.1)
Sodium: 135 mEq/L (ref 135–145)

## 2019-08-08 LAB — HEPATIC FUNCTION PANEL
ALT: 14 U/L (ref 0–53)
AST: 15 U/L (ref 0–37)
Albumin: 4.4 g/dL (ref 3.5–5.2)
Alkaline Phosphatase: 67 U/L (ref 39–117)
Bilirubin, Direct: 0.1 mg/dL (ref 0.0–0.3)
Total Bilirubin: 0.7 mg/dL (ref 0.2–1.2)
Total Protein: 7.3 g/dL (ref 6.0–8.3)

## 2019-08-08 LAB — LIPID PANEL
Cholesterol: 208 mg/dL — ABNORMAL HIGH (ref 0–200)
HDL: 48.9 mg/dL (ref >39.00)
LDL Cholesterol: 136 mg/dL — ABNORMAL HIGH (ref 0–99)
NonHDL: 159.26
Total CHOL/HDL Ratio: 4
Triglycerides: 118 mg/dL (ref 0.0–149.0)
VLDL: 23.6 mg/dL (ref 0.0–40.0)

## 2019-08-08 LAB — TSH: TSH: 0.41 u[IU]/mL (ref 0.35–4.50)

## 2019-08-08 LAB — PSA: PSA: 1.82 ng/mL (ref 0.10–4.00)

## 2019-08-08 NOTE — Assessment & Plan Note (Signed)
Better  

## 2019-08-08 NOTE — Assessment & Plan Note (Signed)
Levothroid 

## 2019-08-08 NOTE — Progress Notes (Signed)
Subjective:  Patient ID: Eric Griffith, male    DOB: Jun 11, 1955  Age: 64 y.o. MRN: [id number]  CC: No chief complaint on file.   HPI Eric Griffith presents for a well exam  Outpatient Medications Prior to Visit  Medication Sig Dispense Refill  . levothyroxine (SYNTHROID) 125 MCG tablet TAKE 1 TABLET(125 MCG) BY MOUTH DAILY 90 tablet 1  . TALTZ 80 MG/ML SOAJ     . Cholecalciferol (VITAMIN D3) 2000 UNITS capsule Take 1 capsule (2,000 Units total) by mouth daily. (Patient not taking: Reported on 08/08/2019) 100 capsule 3  . meloxicam (MOBIC) 15 MG tablet TAKE 1 TABLET(15 MG) BY MOUTH DAILY (Patient not taking: Reported on 08/08/2019) 30 tablet 2   No facility-administered medications prior to visit.    ROS: Review of Systems  Constitutional: Negative for appetite change, fatigue and unexpected weight change.  HENT: Negative for congestion, nosebleeds, sneezing, sore throat and trouble swallowing.   Eyes: Negative for itching and visual disturbance.  Respiratory: Negative for cough.   Cardiovascular: Negative for chest pain, palpitations and leg swelling.  Gastrointestinal: Negative for abdominal distention, blood in stool, diarrhea and nausea.  Genitourinary: Negative for frequency and hematuria.  Musculoskeletal: Negative for back pain, gait problem, joint swelling and neck pain.  Skin: Negative for rash.  Neurological: Negative for dizziness, tremors, speech difficulty and weakness.  Psychiatric/Behavioral: Negative for agitation, dysphoric mood and sleep disturbance. The patient is not nervous/anxious.     Objective:  BP 138/84 (BP Location: Left Arm, Patient Position: Sitting, Cuff Size: Normal)   Pulse (!) 58   Temp 98.1 F (36.7 C) (Oral)   Ht 5\' 6"  (1.676 m)   Wt 185 lb (83.9 kg)   SpO2 98%   BMI 29.86 kg/m   BP Readings from Last 3 Encounters:  08/08/19 138/84  07/05/18 136/84  02/07/18 134/80    Wt Readings from Last 3 Encounters:  08/08/19  185 lb (83.9 kg)  07/05/18 191 lb (86.6 kg)  02/07/18 184 lb (83.5 kg)    Physical Exam Constitutional:      General: He is not in acute distress.    Appearance: He is well-developed.     Comments: NAD  Eyes:     Conjunctiva/sclera: Conjunctivae normal.     Pupils: Pupils are equal, round, and reactive to light.  Neck:     Thyroid: No thyromegaly.     Vascular: No JVD.  Cardiovascular:     Rate and Rhythm: Normal rate and regular rhythm.     Heart sounds: Normal heart sounds. No murmur. No friction rub. No gallop.   Pulmonary:     Effort: Pulmonary effort is normal. No respiratory distress.     Breath sounds: Normal breath sounds. No wheezing or rales.  Chest:     Chest wall: No tenderness.  Abdominal:     General: Bowel sounds are normal. There is no distension.     Palpations: Abdomen is soft. There is no mass.     Tenderness: There is no abdominal tenderness. There is no guarding or rebound.  Genitourinary:    Prostate: Normal.     Rectum: Normal. Guaiac result negative.  Musculoskeletal:        General: No tenderness. Normal range of motion.     Cervical back: Normal range of motion.  Lymphadenopathy:     Cervical: No cervical adenopathy.  Skin:    General: Skin is warm and dry.     Findings: No rash.  Neurological:  Mental Status: He is alert and oriented to person, place, and time.     Cranial Nerves: No cranial nerve deficit.     Motor: No abnormal muscle tone.     Coordination: Coordination normal.     Gait: Gait normal.     Deep Tendon Reflexes: Reflexes are normal and symmetric.  Psychiatric:        Behavior: Behavior normal.        Thought Content: Thought content normal.        Judgment: Judgment normal.     Lab Results  Component Value Date   WBC 6.3 08/08/2019   HGB 16.0 08/08/2019   HCT 47.4 08/08/2019   PLT 290.0 08/08/2019   GLUCOSE 108 (H) 08/08/2019   CHOL 208 (H) 08/08/2019   TRIG 118.0 08/08/2019   HDL 48.90 08/08/2019   LDLDIRECT  133.6 01/25/2012   LDLCALC 136 (H) 08/08/2019   ALT 14 08/08/2019   AST 15 08/08/2019   NA 135 08/08/2019   K 4.1 08/08/2019   CL 99 08/08/2019   CREATININE 0.72 08/08/2019   BUN 9 08/08/2019   CO2 31 08/08/2019   TSH 0.41 08/08/2019   PSA 1.82 08/08/2019   HGBA1C 5.6 06/19/2006    DG Chest 2 View  Result Date: 04/18/2016 CLINICAL DATA:  Routine examination, no current complaints, former smoker, history of hepatitis-C, chronic right bundle branch block, former smoker. EXAM: CHEST  2 VIEW COMPARISON:  None in PACs FINDINGS: The lungs are adequately inflated and clear. The heart and pulmonary vascularity are normal. The mediastinum is normal in width. There is no pleural effusion. 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 IMPRESSION: There is no active cardiopulmonary disease. Electronically Signed   By: David  Martinique M.D.   On: 04/18/2016 08:58    Assessment & Plan:   Walker Kehr, MD

## 2019-08-08 NOTE — Patient Instructions (Addendum)
Cardiac CT calcium scoring test $150 Tel # is [phone]   Computed tomography, more commonly known as a CT or CAT scan, is a diagnostic medical imaging test. Like traditional x-rays, it produces multiple images or pictures of the inside of the body. The cross-sectional images generated during a CT scan can be reformatted in multiple planes. They can even generate three-dimensional images. These images can be viewed on a computer monitor, printed on film or by a 3D printer, or transferred to a CD or DVD. CT images of internal organs, bones, soft tissue and blood vessels provide greater detail than traditional x-rays, particularly of soft tissues and blood vessels. A cardiac CT scan for coronary calcium is a non-invasive way of obtaining information about the presence, location and extent of calcified plaque in the coronary arteries--the vessels that supply oxygen-containing blood to the heart muscle. Calcified plaque results when there is a build-up of fat and other substances under the inner layer of the artery. This material can calcify which signals the presence of atherosclerosis, a disease of the vessel wall, also called coronary artery disease (CAD). People with this disease have an increased risk for heart attacks. In addition, over time, progression of plaque build up (CAD) can narrow the arteries or even close off blood flow to the heart. The result may be chest pain, sometimes called "angina," or a heart attack. Because calcium is a marker of CAD, the amount of calcium detected on a cardiac CT scan is a helpful prognostic tool. The findings on cardiac CT are expressed as a calcium score. Another name for this test is coronary artery calcium scoring.  What are some common uses of the procedure? The goal of cardiac CT scan for calcium scoring is to determine if CAD is present and to what extent, even if there are no symptoms. It is a screening study that may be recommended by a physician for  patients with risk factors for CAD but no clinical symptoms. The major risk factors for CAD are: . high blood cholesterol levels  . family history of heart attacks  . diabetes  . high blood pressure  . cigarette smoking  . overweight or obese  . physical inactivity   A negative cardiac CT scan for calcium scoring shows no calcification within the coronary arteries. This suggests that CAD is absent or so minimal it cannot be seen by this technique. The chance of having a heart attack over the next two to five years is very low under these circumstances. A positive test means that CAD is present, regardless of whether or not the patient is experiencing any symptoms. The amount of calcification--expressed as the calcium score--may help to predict the likelihood of a myocardial infarction (heart attack) in the coming years and helps your medical doctor or cardiologist decide whether the patient may need to take preventive medicine or undertake other measures such as diet and exercise to lower the risk for heart attack. The extent of CAD is graded according to your calcium score:  Calcium Score  Presence of CAD (coronary artery disease)  0 No evidence of CAD   1-10 Minimal evidence of CAD  11-100 Mild evidence of CAD  101-400 Moderate evidence of CAD  Over 400 Extensive evidence of CAD     Wt Readings from Last 3 Encounters:  08/08/19 185 lb (83.9 kg)  07/05/18 191 lb (86.6 kg)  02/07/18 184 lb (83.5 kg)

## 2019-08-08 NOTE — Assessment & Plan Note (Addendum)
We discussed age appropriate health related issues, including available/recomended screening tests and vaccinations. We discussed a need for adhering to healthy diet and exercise. Labs were  reviewed . All questions were answered.  Cologuard 2015 neg Colon 2018 Dr Marina Goodell, due in 2028 A cardiac CT scan for calcium scoring offered - order placed

## 2019-09-21 ENCOUNTER — Other Ambulatory Visit: Payer: Self-pay | Admitting: Internal Medicine

## 2020-03-04 DIAGNOSIS — Z85820 Personal history of malignant melanoma of skin: Secondary | ICD-10-CM | POA: Insufficient documentation

## 2020-08-13 ENCOUNTER — Encounter: Payer: Self-pay | Admitting: Internal Medicine

## 2020-08-13 ENCOUNTER — Other Ambulatory Visit: Payer: Self-pay

## 2020-08-13 ENCOUNTER — Ambulatory Visit (INDEPENDENT_AMBULATORY_CARE_PROVIDER_SITE_OTHER): Payer: Medicare Other | Admitting: Internal Medicine

## 2020-08-13 VITALS — BP 138/86 | HR 65 | Temp 98.0°F | Ht 66.0 in | Wt 184.4 lb

## 2020-08-13 DIAGNOSIS — Z0000 Encounter for general adult medical examination without abnormal findings: Secondary | ICD-10-CM

## 2020-08-13 DIAGNOSIS — R4189 Other symptoms and signs involving cognitive functions and awareness: Secondary | ICD-10-CM | POA: Diagnosis not present

## 2020-08-13 LAB — CBC WITH DIFFERENTIAL/PLATELET
Basophils Absolute: 0.1 10*3/uL (ref 0.0–0.1)
Basophils Relative: 2 % (ref 0.0–3.0)
Eosinophils Absolute: 0.4 10*3/uL (ref 0.0–0.7)
Eosinophils Relative: 7.1 % — ABNORMAL HIGH (ref 0.0–5.0)
HCT: 46.6 % (ref 39.0–52.0)
Hemoglobin: 16 g/dL (ref 13.0–17.0)
Lymphocytes Relative: 31.6 % (ref 12.0–46.0)
Lymphs Abs: 1.9 10*3/uL (ref 0.7–4.0)
MCHC: 34.3 g/dL (ref 30.0–36.0)
MCV: 89.5 fl (ref 78.0–100.0)
Monocytes Absolute: 0.6 10*3/uL (ref 0.1–1.0)
Monocytes Relative: 9.2 % (ref 3.0–12.0)
Neutro Abs: 3.1 10*3/uL (ref 1.4–7.7)
Neutrophils Relative %: 50.1 % (ref 43.0–77.0)
Platelets: 270 10*3/uL (ref 150.0–400.0)
RBC: 5.2 Mil/uL (ref 4.22–5.81)
RDW: 13.5 % (ref 11.5–15.5)
WBC: 6.2 10*3/uL (ref 4.0–10.5)

## 2020-08-13 LAB — URINALYSIS
Bilirubin Urine: NEGATIVE
Hgb urine dipstick: NEGATIVE
Ketones, ur: NEGATIVE
Leukocytes,Ua: NEGATIVE
Nitrite: NEGATIVE
Specific Gravity, Urine: 1.015 (ref 1.000–1.030)
Total Protein, Urine: NEGATIVE
Urine Glucose: NEGATIVE
Urobilinogen, UA: 0.2 (ref 0.0–1.0)
pH: 5.5 (ref 5.0–8.0)

## 2020-08-13 LAB — COMPREHENSIVE METABOLIC PANEL
ALT: 14 U/L (ref 0–53)
AST: 15 U/L (ref 0–37)
Albumin: 4.8 g/dL (ref 3.5–5.2)
Alkaline Phosphatase: 63 U/L (ref 39–117)
BUN: 14 mg/dL (ref 6–23)
CO2: 26 mEq/L (ref 19–32)
Calcium: 9.8 mg/dL (ref 8.4–10.5)
Chloride: 99 mEq/L (ref 96–112)
Creatinine, Ser: 0.76 mg/dL (ref 0.40–1.50)
GFR: 93.59 mL/min (ref >60.00)
Glucose, Bld: 98 mg/dL (ref 70–99)
Potassium: 4.2 mEq/L (ref 3.5–5.1)
Sodium: 136 mEq/L (ref 135–145)
Total Bilirubin: 0.7 mg/dL (ref 0.2–1.2)
Total Protein: 7.4 g/dL (ref 6.0–8.3)

## 2020-08-13 LAB — LIPID PANEL
Cholesterol: 223 mg/dL — ABNORMAL HIGH (ref 0–200)
HDL: 56.4 mg/dL (ref >39.00)
LDL Cholesterol: 153 mg/dL — ABNORMAL HIGH (ref 0–99)
NonHDL: 166.61
Total CHOL/HDL Ratio: 4
Triglycerides: 67 mg/dL (ref 0.0–149.0)
VLDL: 13.4 mg/dL (ref 0.0–40.0)

## 2020-08-13 LAB — TSH: TSH: 0.15 u[IU]/mL — ABNORMAL LOW (ref 0.35–4.50)

## 2020-08-13 LAB — PSA: PSA: 2.22 ng/mL (ref 0.10–4.00)

## 2020-08-13 NOTE — Patient Instructions (Signed)
You can try Lion's Mane Mushroom capsules for memory, focus, neuropathy ( [email or web address])    

## 2020-08-13 NOTE — Progress Notes (Signed)
Subjective:  Patient ID: Eric Griffith, male    DOB: 07-02-1955  Age: 65 y.o. MRN: [id number]  CC: Annual Exam   HPI Eric Griffith presents for a well exam C/o brain fog   Outpatient Medications Prior to Visit  Medication Sig Dispense Refill  . levothyroxine (SYNTHROID) 125 MCG tablet TAKE 1 TABLET(125 MCG) BY MOUTH DAILY 90 tablet 3  . TALTZ 80 MG/ML SOAJ      No facility-administered medications prior to visit.    ROS: Review of Systems  Constitutional: Negative for appetite change, fatigue and unexpected weight change.  HENT: Negative for congestion, nosebleeds, sneezing, sore throat and trouble swallowing.   Eyes: Negative for itching and visual disturbance.  Respiratory: Negative for cough.   Cardiovascular: Negative for chest pain, palpitations and leg swelling.  Gastrointestinal: Negative for abdominal distention, blood in stool, diarrhea and nausea.  Genitourinary: Negative for frequency and hematuria.  Musculoskeletal: Positive for gait problem. Negative for back pain, joint swelling and neck pain.  Skin: Negative for rash.  Neurological: Negative for dizziness, tremors, speech difficulty and weakness.  Psychiatric/Behavioral: Negative for agitation, dysphoric mood and sleep disturbance. The patient is not nervous/anxious.     Objective:  BP 138/86 (BP Location: Left Arm)   Pulse 65   Temp 98 F (36.7 C) (Oral)   Ht 5\' 6"  (1.676 m)   Wt 184 lb 6.4 oz (83.6 kg)   SpO2 98%   BMI 29.76 kg/m   BP Readings from Last 3 Encounters:  08/13/20 138/86  08/08/19 138/84  07/05/18 136/84    Wt Readings from Last 3 Encounters:  08/13/20 184 lb 6.4 oz (83.6 kg)  08/08/19 185 lb (83.9 kg)  07/05/18 191 lb (86.6 kg)    Physical Exam Constitutional:      General: He is not in acute distress.    Appearance: He is well-developed.     Comments: NAD  Eyes:     Conjunctiva/sclera: Conjunctivae normal.     Pupils: Pupils are equal, round, and  reactive to light.  Neck:     Thyroid: No thyromegaly.     Vascular: No JVD.  Cardiovascular:     Rate and Rhythm: Normal rate and regular rhythm.     Heart sounds: Normal heart sounds. No murmur heard. No friction rub. No gallop.   Pulmonary:     Effort: Pulmonary effort is normal. No respiratory distress.     Breath sounds: Normal breath sounds. No wheezing or rales.  Chest:     Chest wall: No tenderness.  Abdominal:     General: Bowel sounds are normal. There is no distension.     Palpations: Abdomen is soft. There is no mass.     Tenderness: There is no abdominal tenderness. There is no guarding or rebound.  Genitourinary:    Testes: Normal.     Prostate: Normal.     Rectum: Normal. Guaiac result negative.  Musculoskeletal:        General: No tenderness. Normal range of motion.     Cervical back: Normal range of motion.  Lymphadenopathy:     Cervical: No cervical adenopathy.  Skin:    General: Skin is warm and dry.     Findings: No rash.  Neurological:     Mental Status: He is alert and oriented to person, place, and time.     Cranial Nerves: No cranial nerve deficit.     Motor: No abnormal muscle tone.     Coordination: Coordination normal.  Gait: Gait normal.     Deep Tendon Reflexes: Reflexes are normal and symmetric.  Psychiatric:        Behavior: Behavior normal.        Thought Content: Thought content normal.        Judgment: Judgment normal.     Lab Results  Component Value Date   WBC 6.3 08/08/2019   HGB 16.0 08/08/2019   HCT 47.4 08/08/2019   PLT 290.0 08/08/2019   GLUCOSE 108 (H) 08/08/2019   CHOL 208 (H) 08/08/2019   TRIG 118.0 08/08/2019   HDL 48.90 08/08/2019   LDLDIRECT 133.6 01/25/2012   LDLCALC 136 (H) 08/08/2019   ALT 14 08/08/2019   AST 15 08/08/2019   NA 135 08/08/2019   K 4.1 08/08/2019   CL 99 08/08/2019   CREATININE 0.72 08/08/2019   BUN 9 08/08/2019   CO2 31 08/08/2019   TSH 0.41 08/08/2019   PSA 1.82 08/08/2019   HGBA1C  5.6 06/19/2006    DG Chest 2 View  Result Date: 04/18/2016 CLINICAL DATA:  Routine examination, no current complaints, former smoker, history of hepatitis-C, chronic right bundle branch block, former smoker. EXAM: CHEST  2 VIEW COMPARISON:  None in PACs FINDINGS: The lungs are adequately inflated and clear. The heart and pulmonary vascularity are normal. The mediastinum is normal in width. There is no pleural effusion. 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 IMPRESSION: There is no active cardiopulmonary disease. Electronically Signed   By: David  [place] M.D.   On: 04/18/2016 08:58    Assessment & Plan:    Sonda Primes, MD

## 2020-08-13 NOTE — Assessment & Plan Note (Signed)
try Lion's mane

## 2020-08-14 ENCOUNTER — Other Ambulatory Visit (INDEPENDENT_AMBULATORY_CARE_PROVIDER_SITE_OTHER): Payer: Medicare Other

## 2020-08-14 DIAGNOSIS — E034 Atrophy of thyroid (acquired): Secondary | ICD-10-CM

## 2020-08-14 LAB — T4, FREE: Free T4: 1.31 ng/dL (ref 0.60–1.60)

## 2020-09-05 ENCOUNTER — Other Ambulatory Visit: Payer: Self-pay | Admitting: Internal Medicine

## 2021-06-14 ENCOUNTER — Encounter: Payer: Self-pay | Admitting: Internal Medicine

## 2021-06-14 NOTE — Telephone Encounter (Signed)
Pls enter order for cardiac CT scoring.Marland KitchenRaechel Chute

## 2021-06-15 ENCOUNTER — Other Ambulatory Visit: Payer: Self-pay | Admitting: Internal Medicine

## 2021-06-16 ENCOUNTER — Telehealth: Payer: Self-pay | Admitting: Internal Medicine

## 2021-06-16 ENCOUNTER — Other Ambulatory Visit: Payer: Self-pay | Admitting: Internal Medicine

## 2021-06-16 DIAGNOSIS — E785 Hyperlipidemia, unspecified: Secondary | ICD-10-CM

## 2021-06-16 NOTE — Telephone Encounter (Signed)
Called Eric Griffith back dx code " Dyslipidemia" [E78.5].Marland KitchenRaechel Chute

## 2021-06-16 NOTE — Telephone Encounter (Signed)
[facility] called stating the calcium score had no diagnoisis code. [phone]

## 2021-08-19 ENCOUNTER — Encounter: Payer: Medicare Other | Admitting: Internal Medicine

## 2021-08-19 ENCOUNTER — Ambulatory Visit (INDEPENDENT_AMBULATORY_CARE_PROVIDER_SITE_OTHER)
Admission: RE | Admit: 2021-08-19 | Discharge: 2021-08-19 | Disposition: A | Discharge status: Home / Self Care | Payer: Self-pay | Source: Ambulatory Visit | Attending: Internal Medicine | Admitting: Internal Medicine

## 2021-08-19 DIAGNOSIS — E785 Hyperlipidemia, unspecified: Secondary | ICD-10-CM

## 2021-08-27 ENCOUNTER — Other Ambulatory Visit: Payer: Self-pay | Admitting: Internal Medicine

## 2021-08-27 ENCOUNTER — Encounter: Payer: Self-pay | Admitting: Internal Medicine

## 2021-08-28 ENCOUNTER — Other Ambulatory Visit: Payer: Self-pay | Admitting: Internal Medicine

## 2021-08-28 DIAGNOSIS — E785 Hyperlipidemia, unspecified: Secondary | ICD-10-CM

## 2021-08-28 DIAGNOSIS — I2510 Atherosclerotic heart disease of native coronary artery without angina pectoris: Secondary | ICD-10-CM | POA: Insufficient documentation

## 2021-08-28 MED ORDER — ASPIRIN EC 81 MG PO TBEC: 81.0000 mg | DELAYED_RELEASE_TABLET | Freq: Every day | ORAL | 3 refills | Status: AC

## 2021-09-07 ENCOUNTER — Encounter: Payer: Self-pay | Admitting: Internal Medicine

## 2021-09-07 ENCOUNTER — Ambulatory Visit (INDEPENDENT_AMBULATORY_CARE_PROVIDER_SITE_OTHER): Payer: Medicare Other | Admitting: Internal Medicine

## 2021-09-07 VITALS — BP 122/78 | HR 58 | Temp 98.1°F | Ht 66.0 in | Wt 175.0 lb

## 2021-09-07 DIAGNOSIS — Z0000 Encounter for general adult medical examination without abnormal findings: Secondary | ICD-10-CM | POA: Diagnosis not present

## 2021-09-07 DIAGNOSIS — I2510 Atherosclerotic heart disease of native coronary artery without angina pectoris: Secondary | ICD-10-CM | POA: Diagnosis not present

## 2021-09-07 DIAGNOSIS — I2583 Coronary atherosclerosis due to lipid rich plaque: Secondary | ICD-10-CM

## 2021-09-07 DIAGNOSIS — E034 Atrophy of thyroid (acquired): Secondary | ICD-10-CM | POA: Diagnosis not present

## 2021-09-07 DIAGNOSIS — Z125 Encounter for screening for malignant neoplasm of prostate: Secondary | ICD-10-CM | POA: Diagnosis not present

## 2021-09-07 LAB — CBC WITH DIFFERENTIAL/PLATELET
Basophils Absolute: 0.1 10*3/uL (ref 0.0–0.1)
Basophils Relative: 1.4 % (ref 0.0–3.0)
Eosinophils Absolute: 0.7 10*3/uL (ref 0.0–0.7)
Eosinophils Relative: 10.7 % — ABNORMAL HIGH (ref 0.0–5.0)
HCT: 45.1 % (ref 39.0–52.0)
Hemoglobin: 15.1 g/dL (ref 13.0–17.0)
Lymphocytes Relative: 31.1 % (ref 12.0–46.0)
Lymphs Abs: 2.1 10*3/uL (ref 0.7–4.0)
MCHC: 33.4 g/dL (ref 30.0–36.0)
MCV: 91.4 fl (ref 78.0–100.0)
Monocytes Absolute: 0.6 10*3/uL (ref 0.1–1.0)
Monocytes Relative: 8.7 % (ref 3.0–12.0)
Neutro Abs: 3.2 10*3/uL (ref 1.4–7.7)
Neutrophils Relative %: 48.1 % (ref 43.0–77.0)
Platelets: 257 10*3/uL (ref 150.0–400.0)
RBC: 4.93 Mil/uL (ref 4.22–5.81)
RDW: 13.6 % (ref 11.5–15.5)
WBC: 6.6 10*3/uL (ref 4.0–10.5)

## 2021-09-07 LAB — COMPREHENSIVE METABOLIC PANEL
ALT: 15 U/L (ref 0–53)
AST: 17 U/L (ref 0–37)
Albumin: 4.5 g/dL (ref 3.5–5.2)
Alkaline Phosphatase: 63 U/L (ref 39–117)
BUN: 12 mg/dL (ref 6–23)
CO2: 31 mEq/L (ref 19–32)
Calcium: 9.6 mg/dL (ref 8.4–10.5)
Chloride: 100 mEq/L (ref 96–112)
Creatinine, Ser: 0.66 mg/dL (ref 0.40–1.50)
GFR: 96.93 mL/min (ref >60.00)
Glucose, Bld: 78 mg/dL (ref 70–99)
Potassium: 4.2 mEq/L (ref 3.5–5.1)
Sodium: 137 mEq/L (ref 135–145)
Total Bilirubin: 0.9 mg/dL (ref 0.2–1.2)
Total Protein: 7 g/dL (ref 6.0–8.3)

## 2021-09-07 LAB — LIPID PANEL
Cholesterol: 206 mg/dL — ABNORMAL HIGH (ref 0–200)
HDL: 61.1 mg/dL (ref >39.00)
LDL Cholesterol: 132 mg/dL — ABNORMAL HIGH (ref 0–99)
NonHDL: 145.14
Total CHOL/HDL Ratio: 3
Triglycerides: 67 mg/dL (ref 0.0–149.0)
VLDL: 13.4 mg/dL (ref 0.0–40.0)

## 2021-09-07 LAB — TSH: TSH: 0.09 u[IU]/mL — ABNORMAL LOW (ref 0.35–5.50)

## 2021-09-07 LAB — PSA: PSA: 1.65 ng/mL (ref 0.10–4.00)

## 2021-09-07 LAB — T4, FREE: Free T4: 1.25 ng/dL (ref 0.60–1.60)

## 2021-09-07 MED ORDER — ROSUVASTATIN CALCIUM 5 MG PO TABS: 5.0000 mg | ORAL_TABLET | Freq: Every day | ORAL | 0 refills | Status: DC

## 2021-09-07 MED ORDER — ICOSAPENT ETHYL 1 G PO CAPS: 2.0000 g | ORAL_CAPSULE | Freq: Two times a day (BID) | ORAL | 3 refills | Status: DC

## 2021-09-07 NOTE — Assessment & Plan Note (Addendum)
Start Crestor 5 mg 1-2/wk ?Fish oil ?

## 2021-09-07 NOTE — Progress Notes (Signed)
? ?Subjective:  ?Patient ID: Eric Griffith, male    DOB: 1956-03-16  Age: 66 y.o. MRN: [id number] ? ?CC: No chief complaint on file. ? ? ?HPI ?Eric Griffith presents for a well exam ? ?Outpatient Medications Prior to Visit  ?Medication Sig Dispense Refill  ? aspirin EC 81 MG tablet Take 1 tablet (81 mg total) by mouth daily. 100 tablet 3  ? levothyroxine (SYNTHROID) 125 MCG tablet TAKE 1 TABLET(125 MCG) BY MOUTH DAILY 90 tablet 3  ? TALTZ 80 MG/ML SOAJ     ? levothyroxine (SYNTHROID) 125 MCG tablet Take by mouth.    ? ?No facility-administered medications prior to visit.  ? ? ?ROS: ?Review of Systems  ?Constitutional:  Negative for appetite change, fatigue and unexpected weight change.  ?HENT:  Negative for congestion, nosebleeds, sneezing, sore throat and trouble swallowing.   ?Eyes:  Negative for itching and visual disturbance.  ?Respiratory:  Negative for cough.   ?Cardiovascular:  Negative for chest pain, palpitations and leg swelling.  ?Gastrointestinal:  Negative for abdominal distention, blood in stool, diarrhea and nausea.  ?Genitourinary:  Negative for frequency and hematuria.  ?Musculoskeletal:  Positive for arthralgias. Negative for back pain, gait problem, joint swelling and neck pain.  ?Skin:  Positive for rash.  ?Neurological:  Negative for dizziness, tremors, speech difficulty and weakness.  ?Psychiatric/Behavioral:  Negative for agitation, dysphoric mood and sleep disturbance. The patient is not nervous/anxious.   ? ?Objective:  ?BP 122/78 (BP Location: Left Arm, Patient Position: Sitting, Cuff Size: Large)   Pulse (!) 58   Temp 98.1 ?F (36.7 ?C) (Oral)   Ht 5\' 6"  (1.676 m)   Wt 175 lb (79.4 kg)   SpO2 98%   BMI 28.25 kg/m?  ? ?BP Readings from Last 3 Encounters:  ?09/07/21 122/78  ?08/13/20 138/86  ?08/08/19 138/84  ? ? ?Wt Readings from Last 3 Encounters:  ?09/07/21 175 lb (79.4 kg)  ?08/13/20 184 lb 6.4 oz (83.6 kg)  ?08/08/19 185 lb (83.9 kg)  ? ? ?Physical  Exam ?Constitutional:   ?   General: He is not in acute distress. ?   Appearance: He is well-developed.  ?   Comments: NAD  ?Eyes:  ?   Conjunctiva/sclera: Conjunctivae normal.  ?   Pupils: Pupils are equal, round, and reactive to light.  ?Neck:  ?   Thyroid: No thyromegaly.  ?   Vascular: No JVD.  ?Cardiovascular:  ?   Rate and Rhythm: Normal rate and regular rhythm.  ?   Heart sounds: Normal heart sounds. No murmur heard. ?  No friction rub. No gallop.  ?Pulmonary:  ?   Effort: Pulmonary effort is normal. No respiratory distress.  ?   Breath sounds: Normal breath sounds. No wheezing or rales.  ?Chest:  ?   Chest wall: No tenderness.  ?Abdominal:  ?   General: Bowel sounds are normal. There is no distension.  ?   Palpations: Abdomen is soft. There is no mass.  ?   Tenderness: There is no abdominal tenderness. There is no guarding or rebound.  ?Genitourinary: ?   Prostate: Normal.  ?   Rectum: Normal. Guaiac result negative.  ?Musculoskeletal:     ?   General: No tenderness. Normal range of motion.  ?   Cervical back: Normal range of motion.  ?Lymphadenopathy:  ?   Cervical: No cervical adenopathy.  ?Skin: ?   General: Skin is warm and dry.  ?   Findings: Rash present.  ?Neurological:  ?  Mental Status: He is alert and oriented to person, place, and time.  ?   Cranial Nerves: No cranial nerve deficit.  ?   Motor: No abnormal muscle tone.  ?   Coordination: Coordination normal.  ?   Gait: Gait normal.  ?   Deep Tendon Reflexes: Reflexes are normal and symmetric.  ?Psychiatric:     ?   Behavior: Behavior normal.     ?   Thought Content: Thought content normal.     ?   Judgment: Judgment normal.  ? ?Psoriasis rash ? ? ?Lab Results  ?Component Value Date  ? WBC 6.6 09/07/2021  ? HGB 15.1 09/07/2021  ? HCT 45.1 09/07/2021  ? PLT 257.0 09/07/2021  ? GLUCOSE 78 09/07/2021  ? CHOL 206 (H) 09/07/2021  ? TRIG 67.0 09/07/2021  ? HDL 61.10 09/07/2021  ? LDLDIRECT 133.6 01/25/2012  ? LDLCALC 132 (H) 09/07/2021  ? ALT 15  09/07/2021  ? AST 17 09/07/2021  ? NA 137 09/07/2021  ? K 4.2 09/07/2021  ? CL 100 09/07/2021  ? CREATININE 0.66 09/07/2021  ? BUN 12 09/07/2021  ? CO2 31 09/07/2021  ? TSH 0.09 (L) 09/07/2021  ? PSA 1.65 09/07/2021  ? HGBA1C 5.6 06/19/2006  ? ? ?CT CARDIAC SCORING (SELF PAY ONLY) ? ?Addendum Date: 08/19/2021   ?ADDENDUM REPORT: 08/19/2021 13:06 EXAM: OVER-READ INTERPRETATION  CT CHEST The following report is an over-read performed by radiologist Dr. Laurence Ferrari [facility] on 08/19/2021. This over-read does not include interpretation of cardiac or coronary anatomy or pathology. The coronary calcium score interpretation by the cardiologist is attached. COMPARISON:  Chest radiograph 04/18/2016 FINDINGS: Minimal linear subsegmental atelectasis or scarring in the lower lobes and lingula. Mild thoracic spondylosis. No other significant extracardiac findings. IMPRESSION: 1. Bibasilar subsegmental atelectasis or scarring. Electronically Signed   By: Van Clines M.D.   On: 08/19/2021 13:06  ? ?Result Date: 08/19/2021 ?CLINICAL DATA:  Cardiovascular Disease Risk stratification EXAM: Coronary Calcium Score TECHNIQUE: A gated, non-contrast computed tomography scan of the heart was performed using 73mm slice thickness. Axial images were analyzed on a dedicated workstation. Calcium scoring of the coronary arteries was performed using the Agatston method. FINDINGS: Coronary arteries: Normal origins. Coronary Calcium Score: Left main: 0 Left anterior descending artery: 95 Left circumflex artery: 4 Right coronary artery: 9 Total: 108 Percentile: 51 Pericardium: Normal. Ascending Aorta: Normal caliber.  Aortic atherosclerosis. Non-cardiac: See separate report from [facility]. IMPRESSION: Coronary calcium score of 108. This was 4 percentile for age-, race-, and sex-matched controls. RECOMMENDATIONS: Coronary artery calcium (CAC) score is a strong predictor of incident coronary heart disease (CHD)  and provides predictive information beyond traditional risk factors. CAC scoring is reasonable to use in the decision to withhold, postpone, or initiate statin therapy in intermediate-risk or selected borderline-risk asymptomatic adults (age 20-75 years and LDL-C >=70 to <190 mg/dL) who do not have diabetes or established atherosclerotic cardiovascular disease (ASCVD).* In intermediate-risk (10-year ASCVD risk >=7.5% to <20%) adults or selected borderline-risk (10-year ASCVD risk >=5% to <7.5%) adults in whom a CAC score is measured for the purpose of making a treatment decision the following recommendations have been made: If CAC=0, it is reasonable to withhold statin therapy and reassess in 5 to 10 years, as long as higher risk conditions are absent (diabetes mellitus, family history of premature CHD in first degree relatives (males <55 years; females <65 years), cigarette smoking, or LDL >=190 mg/dL). If CAC is 1 to 99, it is reasonable  to initiate statin therapy for patients >=36 years of age. If CAC is >=100 or >=75th percentile, it is reasonable to initiate statin therapy at any age. Cardiology referral should be considered for patients with CAC scores >=400 or >=75th percentile. *2018 AHA/ACC/AACVPR/AAPA/ABC/ACPM/ADA/AGS/APhA/ASPC/NLA/PCNA Guideline on the Management of Blood Cholesterol: A Report of the American College of Cardiology/American Heart Association Task Force on Clinical Practice Guidelines. J Am Coll Cardiol. 2019;73(24):3168-3209. Electronically Signed: By: Candee Furbish M.D. On: 08/19/2021 11:16  ? ? ?Assessment & Plan:  ? ?Problem List Items Addressed This Visit   ? ? Well adult exam - Primary  ? Relevant Orders  ? TSH (Completed)  ? Urinalysis (Completed)  ? CBC with Differential/Platelet (Completed)  ? Lipid panel (Completed)  ? Comprehensive metabolic panel (Completed)  ? PSA (Completed)  ? T4, free (Completed)  ? Hypothyroidism  ?  Chronic  ?Cont on Levothroid ?Cjheck FT4 ? ?  ?  ?  Relevant Orders  ? T4, free (Completed)  ? Coronary atherosclerosis  ?  Start Crestor 5 mg 1-2/wk ?Fish oil ? ?  ?  ? Relevant Medications  ? rosuvastatin (CRESTOR) 5 MG tablet  ? icosapent Ethyl (VASCEPA) 1 g capsule  ?  ? ? ?Meds ordered

## 2021-09-07 NOTE — Assessment & Plan Note (Signed)
Chronic  ?Cont on Levothroid ?Cjheck FT4 ?

## 2021-09-07 NOTE — Patient Instructions (Signed)
Blue-Emu cream --2-3 times a day ? ?

## 2021-09-08 LAB — URINALYSIS
Bilirubin Urine: NEGATIVE
Hgb urine dipstick: NEGATIVE
Ketones, ur: NEGATIVE
Leukocytes,Ua: NEGATIVE
Nitrite: NEGATIVE
Specific Gravity, Urine: 1.02 (ref 1.000–1.030)
Total Protein, Urine: NEGATIVE
Urine Glucose: NEGATIVE
Urobilinogen, UA: 0.2 (ref 0.0–1.0)
pH: 5.5 (ref 5.0–8.0)

## 2021-09-13 ENCOUNTER — Encounter: Payer: Self-pay | Admitting: Internal Medicine

## 2021-12-01 ENCOUNTER — Other Ambulatory Visit: Payer: Self-pay | Admitting: Internal Medicine

## 2022-02-18 ENCOUNTER — Telehealth: Payer: Self-pay | Admitting: Internal Medicine

## 2022-02-18 NOTE — Telephone Encounter (Signed)
Left message for patient to call back and schedule Medicare Annual Wellness Visit (AWV).   Please offer to do virtually or by telephone.  Left office number and my jabber #[phone].  AWVI eligible as of 12/22/2019  Please schedule at anytime with Nurse Health Advisor.   

## 2022-04-18 ENCOUNTER — Encounter: Payer: Self-pay | Admitting: Internal Medicine

## 2022-07-20 ENCOUNTER — Telehealth: Payer: Self-pay

## 2022-07-20 DIAGNOSIS — I2583 Coronary atherosclerosis due to lipid rich plaque: Secondary | ICD-10-CM

## 2022-07-20 DIAGNOSIS — E785 Hyperlipidemia, unspecified: Secondary | ICD-10-CM

## 2022-07-20 DIAGNOSIS — E034 Atrophy of thyroid (acquired): Secondary | ICD-10-CM

## 2022-07-20 DIAGNOSIS — L408 Other psoriasis: Secondary | ICD-10-CM

## 2022-07-20 DIAGNOSIS — N401 Enlarged prostate with lower urinary tract symptoms: Secondary | ICD-10-CM

## 2022-07-20 NOTE — Telephone Encounter (Signed)
Pt has Medicare.Marland KitchenJohny Griffith

## 2022-07-20 NOTE — Telephone Encounter (Signed)
Okay.  Thanks.

## 2022-07-20 NOTE — Telephone Encounter (Signed)
Pt has stated he would like his labs ordered before his visit on 09/12/2022.  **Please call pt once ordered to sched lab apptmnt for the Thursday before.

## 2022-07-21 NOTE — Telephone Encounter (Signed)
Notified pt MD has place labs. Made appt for labs.Marland KitchenJohny Griffith

## 2022-08-05 ENCOUNTER — Encounter: Payer: Self-pay | Admitting: Internal Medicine

## 2022-08-28 ENCOUNTER — Other Ambulatory Visit: Payer: Self-pay | Admitting: Internal Medicine

## 2022-09-09 ENCOUNTER — Other Ambulatory Visit (INDEPENDENT_AMBULATORY_CARE_PROVIDER_SITE_OTHER): Payer: Medicare Other

## 2022-09-09 DIAGNOSIS — N401 Enlarged prostate with lower urinary tract symptoms: Secondary | ICD-10-CM

## 2022-09-09 DIAGNOSIS — R350 Frequency of micturition: Secondary | ICD-10-CM | POA: Diagnosis not present

## 2022-09-09 DIAGNOSIS — E034 Atrophy of thyroid (acquired): Secondary | ICD-10-CM | POA: Diagnosis not present

## 2022-09-09 DIAGNOSIS — L408 Other psoriasis: Secondary | ICD-10-CM | POA: Diagnosis not present

## 2022-09-09 DIAGNOSIS — I2583 Coronary atherosclerosis due to lipid rich plaque: Secondary | ICD-10-CM

## 2022-09-09 DIAGNOSIS — I2510 Atherosclerotic heart disease of native coronary artery without angina pectoris: Secondary | ICD-10-CM

## 2022-09-09 DIAGNOSIS — E785 Hyperlipidemia, unspecified: Secondary | ICD-10-CM

## 2022-09-09 LAB — URINALYSIS
Bilirubin Urine: NEGATIVE
Hgb urine dipstick: NEGATIVE
Ketones, ur: NEGATIVE
Leukocytes,Ua: NEGATIVE
Nitrite: NEGATIVE
Specific Gravity, Urine: 1.015 (ref 1.000–1.030)
Total Protein, Urine: NEGATIVE
Urine Glucose: NEGATIVE
Urobilinogen, UA: 0.2 (ref 0.0–1.0)
pH: 5.5 (ref 5.0–8.0)

## 2022-09-09 LAB — CBC WITH DIFFERENTIAL/PLATELET
Basophils Absolute: 0.1 10*3/uL (ref 0.0–0.1)
Basophils Relative: 1.3 % (ref 0.0–3.0)
Eosinophils Absolute: 0.6 10*3/uL (ref 0.0–0.7)
Eosinophils Relative: 9.7 % — ABNORMAL HIGH (ref 0.0–5.0)
HCT: 44.6 % (ref 39.0–52.0)
Hemoglobin: 15.2 g/dL (ref 13.0–17.0)
Lymphocytes Relative: 29.5 % (ref 12.0–46.0)
Lymphs Abs: 1.7 10*3/uL (ref 0.7–4.0)
MCHC: 34.1 g/dL (ref 30.0–36.0)
MCV: 90.9 fl (ref 78.0–100.0)
Monocytes Absolute: 0.6 10*3/uL (ref 0.1–1.0)
Monocytes Relative: 9.4 % (ref 3.0–12.0)
Neutro Abs: 2.9 10*3/uL (ref 1.4–7.7)
Neutrophils Relative %: 50.1 % (ref 43.0–77.0)
Platelets: 276 10*3/uL (ref 150.0–400.0)
RBC: 4.9 Mil/uL (ref 4.22–5.81)
RDW: 13.8 % (ref 11.5–15.5)
WBC: 5.8 10*3/uL (ref 4.0–10.5)

## 2022-09-09 LAB — PSA: PSA: 1.87 ng/mL (ref 0.10–4.00)

## 2022-09-09 LAB — COMPREHENSIVE METABOLIC PANEL
ALT: 14 U/L (ref 0–53)
AST: 17 U/L (ref 0–37)
Albumin: 4.5 g/dL (ref 3.5–5.2)
Alkaline Phosphatase: 62 U/L (ref 39–117)
BUN: 13 mg/dL (ref 6–23)
CO2: 30 mEq/L (ref 19–32)
Calcium: 9.7 mg/dL (ref 8.4–10.5)
Chloride: 99 mEq/L (ref 96–112)
Creatinine, Ser: 0.69 mg/dL (ref 0.40–1.50)
GFR: 94.97 mL/min (ref >60.00)
Glucose, Bld: 100 mg/dL — ABNORMAL HIGH (ref 70–99)
Potassium: 4.7 mEq/L (ref 3.5–5.1)
Sodium: 136 mEq/L (ref 135–145)
Total Bilirubin: 0.7 mg/dL (ref 0.2–1.2)
Total Protein: 6.8 g/dL (ref 6.0–8.3)

## 2022-09-09 LAB — LIPID PANEL
Cholesterol: 144 mg/dL (ref 0–200)
HDL: 50.3 mg/dL (ref >39.00)
LDL Cholesterol: 81 mg/dL (ref 0–99)
NonHDL: 93.37
Total CHOL/HDL Ratio: 3
Triglycerides: 61 mg/dL (ref 0.0–149.0)
VLDL: 12.2 mg/dL (ref 0.0–40.0)

## 2022-09-09 LAB — TSH: TSH: 0.02 u[IU]/mL — ABNORMAL LOW (ref 0.35–5.50)

## 2022-09-09 LAB — T4, FREE: Free T4: 1.48 ng/dL (ref 0.60–1.60)

## 2022-09-12 ENCOUNTER — Ambulatory Visit (INDEPENDENT_AMBULATORY_CARE_PROVIDER_SITE_OTHER): Payer: Medicare Other | Admitting: Internal Medicine

## 2022-09-12 ENCOUNTER — Encounter: Payer: Self-pay | Admitting: Internal Medicine

## 2022-09-12 VITALS — BP 128/80 | HR 53 | Temp 97.9°F | Ht 66.0 in | Wt 177.0 lb

## 2022-09-12 DIAGNOSIS — N401 Enlarged prostate with lower urinary tract symptoms: Secondary | ICD-10-CM

## 2022-09-12 DIAGNOSIS — L304 Erythema intertrigo: Secondary | ICD-10-CM

## 2022-09-12 DIAGNOSIS — E034 Atrophy of thyroid (acquired): Secondary | ICD-10-CM | POA: Diagnosis not present

## 2022-09-12 DIAGNOSIS — E785 Hyperlipidemia, unspecified: Secondary | ICD-10-CM | POA: Diagnosis not present

## 2022-09-12 DIAGNOSIS — R350 Frequency of micturition: Secondary | ICD-10-CM

## 2022-09-12 DIAGNOSIS — Z0000 Encounter for general adult medical examination without abnormal findings: Secondary | ICD-10-CM

## 2022-09-12 DIAGNOSIS — Z23 Encounter for immunization: Secondary | ICD-10-CM

## 2022-09-12 MED ORDER — CLOTRIMAZOLE-BETAMETHASONE 1-0.05 % EX CREA: 1.0000 | TOPICAL_CREAM | Freq: Two times a day (BID) | CUTANEOUS | 1 refills | Status: AC

## 2022-09-12 NOTE — Progress Notes (Signed)
Subjective:  Patient ID: Eric Griffith, male    DOB: [birth year]  Age: 67 y.o. MRN: [id number]  CC: No chief complaint on file.   HPI Eric Griffith presents for a well exam  Outpatient Medications Prior to Visit  Medication Sig Dispense Refill   icosapent Ethyl (VASCEPA) 1 g capsule Take 2 capsules (2 g total) by mouth 2 (two) times daily. 360 capsule 3   levothyroxine (SYNTHROID) 125 MCG tablet TAKE 1 TABLET(125 MCG) BY MOUTH DAILY 90 tablet 3   rosuvastatin (CRESTOR) 5 MG tablet TAKE 1 TABLET(5 MG) BY MOUTH DAILY 90 tablet 3   TALTZ 80 MG/ML SOAJ      No facility-administered medications prior to visit.    ROS: Review of Systems  Constitutional:  Negative for appetite change, fatigue and unexpected weight change.  HENT:  Negative for congestion, nosebleeds, sneezing, sore throat and trouble swallowing.   Eyes:  Negative for itching and visual disturbance.  Respiratory:  Negative for cough.   Cardiovascular:  Negative for chest pain, palpitations and leg swelling.  Gastrointestinal:  Negative for abdominal distention, blood in stool, diarrhea and nausea.  Genitourinary:  Negative for frequency and hematuria.  Musculoskeletal:  Negative for back pain, gait problem, joint swelling and neck pain.  Skin:  Positive for rash.  Neurological:  Negative for dizziness, tremors, speech difficulty and weakness.  Psychiatric/Behavioral:  Negative for agitation, dysphoric mood and sleep disturbance. The patient is not nervous/anxious.     Objective:  BP 128/80   Pulse (!) 53   Temp 97.9 F (36.6 C) (Oral)   Ht 5\' 6"  (1.676 m)   Wt 177 lb (80.3 kg)   BMI 28.57 kg/m   BP Readings from Last 3 Encounters:  09/12/22 128/80  09/07/21 122/78  08/13/20 138/86    Wt Readings from Last 3 Encounters:  09/12/22 177 lb (80.3 kg)  09/07/21 175 lb (79.4 kg)  08/13/20 184 lb 6.4 oz (83.6 kg)    Physical Exam Constitutional:      General: He is not in acute distress.     Appearance: Normal appearance. He is well-developed.     Comments: NAD  Eyes:     Conjunctiva/sclera: Conjunctivae normal.     Pupils: Pupils are equal, round, and reactive to light.  Neck:     Thyroid: No thyromegaly.     Vascular: No JVD.  Cardiovascular:     Rate and Rhythm: Normal rate and regular rhythm.     Heart sounds: Normal heart sounds. No murmur heard.    No friction rub. No gallop.  Pulmonary:     Effort: Pulmonary effort is normal. No respiratory distress.     Breath sounds: Normal breath sounds. No wheezing or rales.  Chest:     Chest wall: No tenderness.  Abdominal:     General: Bowel sounds are normal. There is no distension.     Palpations: Abdomen is soft. There is no mass.     Tenderness: There is no abdominal tenderness. There is no guarding or rebound.  Genitourinary:    Rectum: Normal. Guaiac result negative.  Musculoskeletal:        General: No tenderness. Normal range of motion.     Cervical back: Normal range of motion.     Right lower leg: No edema.     Left lower leg: No edema.  Lymphadenopathy:     Cervical: No cervical adenopathy.  Skin:    General: Skin is warm and dry.  Findings: No rash.  Neurological:     Mental Status: He is alert and oriented to person, place, and time.     Cranial Nerves: No cranial nerve deficit.     Motor: No abnormal muscle tone.     Coordination: Coordination normal.     Gait: Gait normal.     Deep Tendon Reflexes: Reflexes are normal and symmetric.  Psychiatric:        Behavior: Behavior normal.        Thought Content: Thought content normal.        Judgment: Judgment normal.   Scrotum w/rash Prostate 1+  Form filled   Lab Results  Component Value Date   WBC 5.8 09/09/2022   HGB 15.2 09/09/2022   HCT 44.6 09/09/2022   PLT 276.0 09/09/2022   GLUCOSE 100 (H) 09/09/2022   CHOL 144 09/09/2022   TRIG 61.0 09/09/2022   HDL 50.30 09/09/2022   LDLDIRECT 133.6 01/25/2012   LDLCALC 81 09/09/2022   ALT  14 09/09/2022   AST 17 09/09/2022   NA 136 09/09/2022   K 4.7 09/09/2022   CL 99 09/09/2022   CREATININE 0.69 09/09/2022   BUN 13 09/09/2022   CO2 30 09/09/2022   TSH 0.02 (L) 09/09/2022   PSA 1.87 09/09/2022   HGBA1C 5.6 06/19/2006    CT CARDIAC SCORING (SELF PAY ONLY)  Addendum Date: 08/19/2021   ADDENDUM REPORT: 08/19/2021 13:06 EXAM: OVER-READ INTERPRETATION  CT CHEST The following report is an over-read performed by radiologist Dr. Jarrett Ables [facility] on 08/19/2021. This over-read does not include interpretation of cardiac or coronary anatomy or pathology. The coronary calcium score interpretation by the cardiologist is attached. COMPARISON:  Chest radiograph 04/18/2016 FINDINGS: Minimal linear subsegmental atelectasis or scarring in the lower lobes and lingula. Mild thoracic spondylosis. No other significant extracardiac findings. IMPRESSION: 1. Bibasilar subsegmental atelectasis or scarring. Electronically Signed   By: Gaylyn Rong M.D.   On: 08/19/2021 13:06   Result Date: 08/19/2021 CLINICAL DATA:  Cardiovascular Disease Risk stratification EXAM: Coronary Calcium Score TECHNIQUE: A gated, non-contrast computed tomography scan of the heart was performed using 3mm slice thickness. Axial images were analyzed on a dedicated workstation. Calcium scoring of the coronary arteries was performed using the Agatston method. FINDINGS: Coronary arteries: Normal origins. Coronary Calcium Score: Left main: 0 Left anterior descending artery: 95 Left circumflex artery: 4 Right coronary artery: 9 Total: 108 Percentile: 51 Pericardium: Normal. Ascending Aorta: Normal caliber.  Aortic atherosclerosis. Non-cardiac: See separate report from [facility]. IMPRESSION: Coronary calcium score of 108. This was 60 percentile for age-, race-, and sex-matched controls. RECOMMENDATIONS: Coronary artery calcium (CAC) score is a strong predictor of incident coronary heart disease (CHD)  and provides predictive information beyond traditional risk factors. CAC scoring is reasonable to use in the decision to withhold, postpone, or initiate statin therapy in intermediate-risk or selected borderline-risk asymptomatic adults (age 47-75 years and LDL-C >=70 to <190 mg/dL) who do not have diabetes or established atherosclerotic cardiovascular disease (ASCVD).* In intermediate-risk (10-year ASCVD risk >=7.5% to <20%) adults or selected borderline-risk (10-year ASCVD risk >=5% to <7.5%) adults in whom a CAC score is measured for the purpose of making a treatment decision the following recommendations have been made: If CAC=0, it is reasonable to withhold statin therapy and reassess in 5 to 10 years, as long as higher risk conditions are absent (diabetes mellitus, family history of premature CHD in first degree relatives (males <55 years; females <65 years), cigarette smoking,  or LDL >=190 mg/dL). If CAC is 1 to 99, it is reasonable to initiate statin therapy for patients >=25 years of age. If CAC is >=100 or >=75th percentile, it is reasonable to initiate statin therapy at any age. Cardiology referral should be considered for patients with CAC scores >=400 or >=75th percentile. *2018 AHA/ACC/AACVPR/AAPA/ABC/ACPM/ADA/AGS/APhA/ASPC/NLA/PCNA Guideline on the Management of Blood Cholesterol: A Report of the American College of Cardiology/American Heart Association Task Force on Clinical Practice Guidelines. J Am Coll Cardiol. 2019;73(24):3168-3209. Electronically Signed: By: Donato Schultz M.D. On: 08/19/2021 11:16    Assessment & Plan:   Problem List Items Addressed This Visit       Endocrine   Hypothyroidism - Primary    Cont on Levothroid FT4 is nl      Relevant Orders   TSH   Urinalysis   CBC with Differential/Platelet   Lipid panel   PSA   Comprehensive metabolic panel     Musculoskeletal and Integument   Intertrigo    Lotrisone ceam        Genitourinary   BPH (benign prostatic  hyperplasia)   Relevant Orders   PSA     Other   Dyslipidemia    On Crestor, Vascepa      Relevant Orders   TSH   Urinalysis   CBC with Differential/Platelet   Lipid panel   PSA   Comprehensive metabolic panel   Well adult exam    We discussed age appropriate health related issues, including available/recomended screening tests and vaccinations. We discussed a need for adhering to healthy diet and exercise. Labs were ordered to be later reviewed . All questions were answered.      Relevant Orders   TSH   Urinalysis   CBC with Differential/Platelet   Lipid panel   PSA   Comprehensive metabolic panel      Meds ordered this encounter  Medications   clotrimazole-betamethasone (LOTRISONE) cream    Sig: Apply 1 Application topically 2 (two) times daily.    Dispense:  45 g    Refill:  1      Follow-up: Return in about 1 year (around 09/12/2023) for Wellness Exam.  Sonda Primes, MD

## 2022-09-12 NOTE — Assessment & Plan Note (Signed)
Cont on Levothroid FT4 is nl

## 2022-09-12 NOTE — Assessment & Plan Note (Signed)
Lotrisone ceam

## 2022-09-12 NOTE — Assessment & Plan Note (Signed)
On Crestor, Vascepa

## 2022-09-12 NOTE — Assessment & Plan Note (Signed)
We discussed age appropriate health related issues, including available/recomended screening tests and vaccinations. We discussed a need for adhering to healthy diet and exercise. Labs were ordered to be later reviewed . All questions were answered.   

## 2022-09-12 NOTE — Addendum Note (Signed)
Addended by: Levonne Lapping on: 09/12/2022 09:12 AM   Modules accepted: Orders

## 2022-09-16 ENCOUNTER — Telehealth: Payer: Self-pay | Admitting: Internal Medicine

## 2022-09-16 MED ORDER — ICOSAPENT ETHYL 1 G PO CAPS: 2.0000 g | ORAL_CAPSULE | Freq: Two times a day (BID) | ORAL | 3 refills | Status: DC

## 2022-09-16 MED ORDER — LEVOTHYROXINE SODIUM 125 MCG PO TABS: ORAL_TABLET | ORAL | 3 refills | Status: DC

## 2022-09-16 NOTE — Addendum Note (Signed)
Addended by: Marinus Maw on: 09/16/2022 10:37 AM   Modules accepted: Orders

## 2022-09-16 NOTE — Telephone Encounter (Signed)
Prescription Request  09/16/2022  LOV: 09/12/2022  What is the name of the medication or equipment? icosapent Ethyl (VASCEPA) 1 g capsule  levothyroxine (SYNTHROID) 125 MCG tablet  Have you contacted your pharmacy to request a refill? Yes   Which pharmacy would you like this sent to?  [id number] Cornelious Bryant, [place] [id number] AT [facility] [address] Kathrynn Ducking [place] [address] Phone: [phone] Fax: [phone]    Patient notified that their request is being sent to the clinical staff for review and that they should receive a response within 2 business days.   Please advise at Mobile [phone] (mobile)   Patient took last pill today 09/16/22.

## 2022-09-16 NOTE — Telephone Encounter (Signed)
Rx sent 

## 2023-04-17 ENCOUNTER — Ambulatory Visit (INDEPENDENT_AMBULATORY_CARE_PROVIDER_SITE_OTHER): Payer: Medicare Other

## 2023-04-17 VITALS — Ht 66.0 in | Wt 175.0 lb

## 2023-04-17 DIAGNOSIS — Z0000 Encounter for general adult medical examination without abnormal findings: Secondary | ICD-10-CM

## 2023-04-17 NOTE — Patient Instructions (Signed)
Eric Griffith , Thank you for taking time to come for your Medicare Wellness Visit. I appreciate your ongoing commitment to your health goals. Please review the following plan we discussed and let me know if I can assist you in the future.   Referrals/Orders/Follow-Ups/Clinician Recommendations: You are due for a Shingles vaccine.  If you decide to get it, you will get it at your local pharmacy.   Remember to call the office to schedule your yearly visit with Dr. Posey Rea soon, as the schedule can fill up fast.  Keep up the good work.  This is a list of the screening recommended for you and due dates:  Health Maintenance  Topic Date Due   COVID-19 Vaccine (3 - Moderna risk series) 05/03/2023*   Zoster (Shingles) Vaccine (1 of 2) 07/21/2023*   DTaP/Tdap/Td vaccine (2 - Td or Tdap) 10/10/2023   Medicare Annual Wellness Visit  04/16/2024   Colon Cancer Screening  12/13/2026   Pneumonia Vaccine  Completed   Flu Shot  Completed   Hepatitis C Screening  Completed   HPV Vaccine  Aged Out  *Topic was postponed. The date shown is not the original due date.    Advanced directives: (Copy Requested) Please bring a copy of your health care power of attorney and living will to the office to be added to your chart at your convenience.  Next Medicare Annual Wellness Visit scheduled for next year: Yes

## 2023-04-17 NOTE — Progress Notes (Signed)
Subjective:   Eric Griffith is a 67 y.o. male who presents for an Initial Medicare Annual Wellness Visit.  Visit Complete: Virtual I connected with  Eric Griffith on 04/17/23 by a audio enabled telemedicine application and verified that I am speaking with the correct person using two identifiers.  Patient Location: Home  Provider Location: Office/Clinic  I discussed the limitations of evaluation and management by telemedicine. The patient expressed understanding and agreed to proceed.  Vital Signs: Because this visit was a virtual/telehealth visit, some criteria may be missing or patient reported. Any vitals not documented were not able to be obtained and vitals that have been documented are patient reported.   Cardiac Risk Factors include: advanced age (>55men, >17 women);male gender;dyslipidemia;Other (see comment), Risk factor comments: Coronary atherosclerosis, BPH     Objective:    Today's Vitals   04/17/23 0923  Weight: 175 lb (79.4 kg)  Height: 5\' 6"  (1.676 m)   Body mass index is 28.25 kg/m.     04/17/2023    9:56 AM 11/28/2016    8:57 AM  Advanced Directives  Does Patient Have a Medical Advance Directive? Yes Yes  Type of Estate agent of [place];Living will Healthcare Power of [place];Living will  Copy of Healthcare Power of Attorney in Chart? No - copy requested     Current Medications (verified) Outpatient Encounter Medications as of 04/17/2023  Medication Sig   clotrimazole-betamethasone (LOTRISONE) cream Apply 1 Application topically 2 (two) times daily.   icosapent Ethyl (VASCEPA) 1 g capsule Take 2 capsules (2 g total) by mouth 2 (two) times daily.   levothyroxine (SYNTHROID) 125 MCG tablet TAKE 1 TABLET(125 MCG) BY MOUTH DAILY   rosuvastatin (CRESTOR) 5 MG tablet TAKE 1 TABLET(5 MG) BY MOUTH DAILY   TALTZ 80 MG/ML SOAJ    No facility-administered encounter medications on file as of 04/17/2023.    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 No pertinent surgical history. Family History  Problem Relation Age of Onset   Cancer Mother        Pancreatic cancer   Cancer Father        Lung Cancer   Colon cancer Neg Hx    Esophageal cancer Neg Hx    Rectal cancer Neg Hx    Stomach cancer Neg Hx    Social History   Socioeconomic History   Marital status: Married    Spouse name: [name]   Number of children: 1   Years of education: Not on file   Highest education level: Not on file  Occupational History   Occupation: Retired/ Event organiser: [employer]  Tobacco Use   Smoking status: Former   Smokeless tobacco: Never  Advertising account planner   Vaping status: Former  Substance and Sexual Activity   Alcohol use: Yes    Comment: occasionally    Drug use: No   Sexual activity: Yes  Other Topics Concern   Not on file  Social History Narrative   Live with wife.   Social Determinants of Health   Financial Resource Strain: Low Risk  (04/17/2023)   Overall Financial Resource Strain (CARDIA)  Difficulty of Paying Living Expenses: Not hard at all  Food Insecurity: No Food Insecurity (04/17/2023)   Hunger Vital Sign    Worried About Running Out of Food in the Last Year: Never true    Ran Out of Food in the Last Year: Never true  Transportation Needs: No Transportation Needs (04/17/2023)   PRAPARE - Administrator, Civil Service (Medical): No    Lack of Transportation (Non-Medical): No  Physical Activity: Sufficiently Active (04/17/2023)   Exercise Vital Sign    Days of Exercise per Week: 5 days    Minutes of Exercise per Session: 60 min  Stress: No Stress  Concern Present (04/17/2023)   [employer] of Occupational Health - Occupational Stress Questionnaire    Feeling of Stress : Not at all  Social Connections: Socially Integrated (04/17/2023)   Social Connection and Isolation Panel [NHANES]    Frequency of Communication with Friends and Family: Three times a week    Frequency of Social Gatherings with Friends and Family: More than three times a week    Attends Religious Services: More than 4 times per year    Active Member of Clubs or Organizations: Yes    Attends Engineer, structural: More than 4 times per year    Marital Status: Married    Tobacco Counseling Counseling given: Not Answered   Clinical Intake:  Pre-visit preparation completed: Yes  Pain : No/denies pain     BMI - recorded: 28.25 Nutritional Status: BMI 25 -29 Overweight Nutritional Risks: None Diabetes: No  How often do you need to have someone help you when you read instructions, pamphlets, or other written materials from your doctor or pharmacy?: 1 - Never  Interpreter Needed?: No  Information entered by :: Landynn Dupler, RMA   Activities of Daily Living    04/17/2023    9:23 AM  In your present state of health, do you have any difficulty performing the following activities:  Hearing? 0  Vision? 0  Difficulty concentrating or making decisions? 0  Walking or climbing stairs? 0  Dressing or bathing? 0  Doing errands, shopping? 0  Preparing Food and eating ? N  Using the Toilet? N  In the past six months, have you accidently leaked urine? N  Do you have problems with loss of bowel control? N  Managing your Medications? N  Managing your Finances? N  Housekeeping or managing your Housekeeping? N    Patient Care Team: Plotnikov, Georgina Quint, MD as PCP - [facility] (Optometry)  Indicate any recent Medical Services you may have received from other than [facility] providers in the past year (date may be  approximate).     Assessment:   This is a routine wellness examination for [facility].  Hearing/Vision screen Hearing Screening - Comments:: Denies hearing difficulties   Vision Screening - Comments:: Denies vision issues   Goals Addressed               This Visit's Progress     Patient Stated (pt-stated)        Stay healthy      Depression Screen    04/17/2023   10:01 AM 09/12/2022    8:04 AM 09/07/2021    2:29 PM 08/13/2020   10:56 AM 08/13/2020   10:55 AM 02/07/2018    4:03 PM  PHQ 2/9 Scores  PHQ - 2 Score 0 0 0 0 0 0  PHQ- 9 Score 1 1  0  Fall Risk    04/17/2023    9:56 AM 09/12/2022    8:04 AM 09/07/2021    2:29 PM 08/13/2020   10:56 AM  Fall Risk   Falls in the past year? 0 0 0 0  Number falls in past yr: 0 0 0 0  Injury with Fall? 0 0 0 0  Risk for fall due to : No Fall Risks  No Fall Risks No Fall Risks  Follow up Falls prevention discussed;Falls evaluation completed Falls evaluation completed Falls evaluation completed     MEDICARE RISK AT HOME: Medicare Risk at Home Any stairs in or around the home?: Yes If so, are there any without handrails?: Yes Home free of loose throw rugs in walkways, pet beds, electrical cords, etc?: Yes Adequate lighting in your home to reduce risk of falls?: Yes Life alert?: No Use of a cane, walker or w/c?: No Grab bars in the bathroom?: No Shower chair or bench in shower?: No Elevated toilet seat or a handicapped toilet?: No  TIMED UP AND GO:  Was the test performed? No    Cognitive Function:        04/17/2023    9:25 AM  6CIT Screen  What Year? 0 points  What month? 0 points  What time? 0 points  Count back from 20 0 points  Months in reverse 0 points  Repeat phrase 0 points  Total Score 0 points    Immunizations Immunization History  Administered Date(s) Administered   Fluad Quad(high Dose 65+) 03/30/2020, 02/10/2023   Influenza Split 05/26/2011   Influenza, High Dose Seasonal PF 03/13/2019    Influenza,inj,Quad PF,6+ Mos 02/11/2014, 01/15/2015, 04/18/2016, 04/20/2017, 02/07/2018   Moderna SARS-COV2 Booster Vaccination 03/30/2020   Moderna Sars-Covid-2 Vaccination 07/05/2019, 08/02/2019   PNEUMOCOCCAL CONJUGATE-20 09/12/2022   Pneumococcal Conjugate-13 04/20/2017   Tdap 10/09/2013   Zoster, Live 05/26/2011    TDAP status: Up to date  Flu Vaccine status: Up to date  Pneumococcal vaccine status: Up to date  Covid-19 vaccine status: Information provided on how to obtain vaccines.   Qualifies for Shingles Vaccine? Yes   Zostavax completed Yes   Shingrix Completed?: No.    Education has been provided regarding the importance of this vaccine. Patient has been advised to call insurance company to determine out of pocket expense if they have not yet received this vaccine. Advised may also receive vaccine at local pharmacy or Health Dept. Verbalized acceptance and understanding.  Screening Tests Health Maintenance  Topic Date Due   COVID-19 Vaccine (3 - Moderna risk series) 05/03/2023 (Originally 04/27/2020)   Zoster Vaccines- Shingrix (1 of 2) 07/21/2023 (Originally 12/31/1974)   DTaP/Tdap/Td (2 - Td or Tdap) 10/10/2023   Medicare Annual Wellness (AWV)  04/16/2024   Colonoscopy  12/13/2026   Pneumonia Vaccine 27+ Years old  Completed   INFLUENZA VACCINE  Completed   Hepatitis C Screening  Completed   HPV VACCINES  Aged Out    Health Maintenance  There are no preventive care reminders to display for this patient.   Colorectal cancer screening: Type of screening: Colonoscopy. Completed 12/12/2016. Repeat every 10 years  Lung Cancer Screening: (Low Dose CT Chest recommended if Age 66-80 years, 20 pack-year currently smoking OR have quit w/in 15years.) does qualify.   Lung Cancer Screening Referral: N/A  Additional Screening:  Hepatitis C Screening: does qualify; Completed 10/31/2017  Vision Screening: Recommended annual ophthalmology exams for early detection of  glaucoma and other disorders of the eye. Is the patient  up to date with their annual eye exam?  Yes  Who is the provider or what is the name of the office in which the patient attends annual eye exams? [facility] If pt is not established with a provider, would they like to be referred to a provider to establish care? No .   Dental Screening: Recommended annual dental exams for proper oral hygiene   Community Resource Referral / Chronic Care Management: CRR required this visit?  No   CCM required this visit?  No    Plan:     I have personally reviewed and noted the following in the patient's chart:   Medical and social history Use of alcohol, tobacco or illicit drugs  Current medications and supplements including opioid prescriptions. Patient is not currently taking opioid prescriptions. Functional ability and status Nutritional status Physical activity Advanced directives List of other physicians Hospitalizations, surgeries, and ER visits in previous 12 months Vitals Screenings to include cognitive, depression, and falls Referrals and appointments  In addition, I have reviewed and discussed with patient certain preventive protocols, quality metrics, and best practice recommendations. A written personalized care plan for preventive services as well as general preventive health recommendations were provided to patient.     Fantasha Daniele L Tarig Zimmers, CMA   04/17/2023   After Visit Summary: (MyChart) Due to this being a telephonic visit, the after visit summary with patients personalized plan was offered to patient via MyChart   Nurse Notes: Patient is due for a Shingrix vaccine, (2 dose).  He stated that he is not sure of getting anymore Covid vaccines.  Patient will call the office to schedule a yearly visit with Dr. Posey Rea when time gets closer to April.  Patient had no concerns to address today.

## 2023-05-25 ENCOUNTER — Other Ambulatory Visit: Payer: Self-pay | Admitting: Internal Medicine

## 2023-08-23 ENCOUNTER — Other Ambulatory Visit: Payer: Self-pay | Admitting: Internal Medicine

## 2023-09-14 ENCOUNTER — Ambulatory Visit (INDEPENDENT_AMBULATORY_CARE_PROVIDER_SITE_OTHER): Admitting: Internal Medicine

## 2023-09-14 ENCOUNTER — Telehealth: Payer: Self-pay | Admitting: Internal Medicine

## 2023-09-14 ENCOUNTER — Encounter: Payer: Self-pay | Admitting: Internal Medicine

## 2023-09-14 ENCOUNTER — Other Ambulatory Visit: Payer: Medicare Other

## 2023-09-14 VITALS — BP 124/78 | HR 60 | Temp 97.7°F | Ht 66.0 in | Wt 172.8 lb

## 2023-09-14 DIAGNOSIS — L408 Other psoriasis: Secondary | ICD-10-CM

## 2023-09-14 DIAGNOSIS — Z0000 Encounter for general adult medical examination without abnormal findings: Secondary | ICD-10-CM

## 2023-09-14 DIAGNOSIS — G4700 Insomnia, unspecified: Secondary | ICD-10-CM | POA: Diagnosis not present

## 2023-09-14 DIAGNOSIS — L4050 Arthropathic psoriasis, unspecified: Secondary | ICD-10-CM

## 2023-09-14 DIAGNOSIS — E034 Atrophy of thyroid (acquired): Secondary | ICD-10-CM

## 2023-09-14 LAB — URINALYSIS
Bilirubin Urine: NEGATIVE
Hgb urine dipstick: NEGATIVE
Ketones, ur: NEGATIVE
Leukocytes,Ua: NEGATIVE
Nitrite: NEGATIVE
Specific Gravity, Urine: 1.015 (ref 1.000–1.030)
Total Protein, Urine: NEGATIVE
Urine Glucose: NEGATIVE
Urobilinogen, UA: 0.2 (ref 0.0–1.0)
pH: 6 (ref 5.0–8.0)

## 2023-09-14 LAB — CBC WITH DIFFERENTIAL/PLATELET
Basophils Absolute: 0.1 10*3/uL (ref 0.0–0.1)
Basophils Relative: 1.2 % (ref 0.0–3.0)
Eosinophils Absolute: 0.7 10*3/uL (ref 0.0–0.7)
Eosinophils Relative: 8.9 % — ABNORMAL HIGH (ref 0.0–5.0)
HCT: 47 % (ref 39.0–52.0)
Hemoglobin: 15.7 g/dL (ref 13.0–17.0)
Lymphocytes Relative: 29.6 % (ref 12.0–46.0)
Lymphs Abs: 2.3 10*3/uL (ref 0.7–4.0)
MCHC: 33.5 g/dL (ref 30.0–36.0)
MCV: 93.6 fl (ref 78.0–100.0)
Monocytes Absolute: 0.6 10*3/uL (ref 0.1–1.0)
Monocytes Relative: 7.4 % (ref 3.0–12.0)
Neutro Abs: 4.2 10*3/uL (ref 1.4–7.7)
Neutrophils Relative %: 52.9 % (ref 43.0–77.0)
Platelets: 312 10*3/uL (ref 150.0–400.0)
RBC: 5.02 Mil/uL (ref 4.22–5.81)
RDW: 13.6 % (ref 11.5–15.5)
WBC: 7.9 10*3/uL (ref 4.0–10.5)

## 2023-09-14 LAB — LIPID PANEL
Cholesterol: 168 mg/dL (ref 0–200)
HDL: 65.7 mg/dL (ref >39.00)
LDL Cholesterol: 87 mg/dL (ref 0–99)
NonHDL: 102.54
Total CHOL/HDL Ratio: 3
Triglycerides: 80 mg/dL (ref 0.0–149.0)
VLDL: 16 mg/dL (ref 0.0–40.0)

## 2023-09-14 LAB — COMPREHENSIVE METABOLIC PANEL WITH GFR
ALT: 16 U/L (ref 0–53)
AST: 19 U/L (ref 0–37)
Albumin: 4.7 g/dL (ref 3.5–5.2)
Alkaline Phosphatase: 64 U/L (ref 39–117)
BUN: 13 mg/dL (ref 6–23)
CO2: 31 meq/L (ref 19–32)
Calcium: 10.1 mg/dL (ref 8.4–10.5)
Chloride: 99 meq/L (ref 96–112)
Creatinine, Ser: 0.71 mg/dL (ref 0.40–1.50)
GFR: 93.49 mL/min (ref >60.00)
Glucose, Bld: 86 mg/dL (ref 70–99)
Potassium: 4.6 meq/L (ref 3.5–5.1)
Sodium: 137 meq/L (ref 135–145)
Total Bilirubin: 0.9 mg/dL (ref 0.2–1.2)
Total Protein: 7.5 g/dL (ref 6.0–8.3)

## 2023-09-14 LAB — PSA: PSA: 2.09 ng/mL (ref 0.10–4.00)

## 2023-09-14 LAB — TSH: TSH: 0.04 u[IU]/mL — ABNORMAL LOW (ref 0.35–5.50)

## 2023-09-14 NOTE — Assessment & Plan Note (Signed)
Cont on Levothroid FT4 is nl

## 2023-09-14 NOTE — Assessment & Plan Note (Signed)
 Dr Joanne Muckle Using Delford Felling

## 2023-09-14 NOTE — Assessment & Plan Note (Addendum)
 Continue with current prescription therapy as reflected on the Med list. Using Delford Felling

## 2023-09-14 NOTE — Telephone Encounter (Signed)
 Patient has already come for appt today.

## 2023-09-14 NOTE — Telephone Encounter (Signed)
 Copied from CRM [id number]. Topic: Clinical - Request for Lab/Test Order >> Sep 14, 2023  8:33 AM Tiffany B wrote: Reason for CRM Patient would like to have pre visit physical labs prior to 2pm physical appointment so PCP can read labs at appointment.

## 2023-09-14 NOTE — Assessment & Plan Note (Signed)
Valerian root 

## 2023-09-14 NOTE — Assessment & Plan Note (Addendum)
 We discussed age appropriate health related issues, including available/recomended screening tests and vaccinations. We discussed a need for adhering to healthy diet and exercise. Labs were ordered to be later reviewed . All questions were answered.  Cologuard 2015 neg Colon 2018 Dr Elvin Hammer, due in 2028 A cardiac CT scan for calcium  scoring offered 2/20 tDAP is due in 2028

## 2023-09-14 NOTE — Progress Notes (Signed)
 Subjective:  Patient ID: Eric Griffith, male    DOB: 1955/07/30  Age: 68 y.o. MRN: [id number]  CC: Annual Exam (Annual Exam. Patient is only taking Crestor  on Monday and Friday, script written as daily, would like clarification)   HPI Donivan Thammavong presents for a well exam  Outpatient Medications Prior to Visit  Medication Sig Dispense Refill   icosapent  Ethyl (VASCEPA ) 1 g capsule TAKE 2 CAPSULES(2 GRAMS) BY MOUTH TWICE DAILY 360 capsule 3   levothyroxine  (SYNTHROID ) 125 MCG tablet TAKE 1 TABLET(125 MCG) BY MOUTH DAILY 90 tablet 3   rosuvastatin  (CRESTOR ) 5 MG tablet TAKE 1 TABLET(5 MG) BY MOUTH DAILY (Patient taking differently: Patient only taking two tablets a week) 90 tablet 3   TALTZ 80 MG/ML SOAJ      No facility-administered medications prior to visit.    ROS: Review of Systems  Constitutional:  Negative for appetite change, fatigue and unexpected weight change.  HENT:  Negative for congestion, nosebleeds, sneezing, sore throat and trouble swallowing.   Eyes:  Negative for itching and visual disturbance.  Respiratory:  Negative for cough.   Cardiovascular:  Negative for chest pain, palpitations and leg swelling.  Gastrointestinal:  Negative for abdominal distention, blood in stool, diarrhea and nausea.  Genitourinary:  Negative for frequency and hematuria.  Musculoskeletal:  Negative for back pain, gait problem, joint swelling and neck pain.  Skin:  Negative for rash.  Neurological:  Negative for dizziness, tremors, speech difficulty and weakness.  Psychiatric/Behavioral:  Negative for agitation, dysphoric mood and sleep disturbance. The patient is not nervous/anxious.     Objective:  BP 124/78   Pulse 60   Temp 97.7 F (36.5 C)   Ht 5\' 6"  (1.676 m)   Wt 172 lb 12.8 oz (78.4 kg)   SpO2 98%   BMI 27.89 kg/m   BP Readings from Last 3 Encounters:  09/14/23 124/78  09/12/22 128/80  09/07/21 122/78    Wt Readings from Last 3 Encounters:   09/14/23 172 lb 12.8 oz (78.4 kg)  04/17/23 175 lb (79.4 kg)  09/12/22 177 lb (80.3 kg)    Physical Exam Constitutional:      General: He is not in acute distress.    Appearance: He is well-developed. He is not ill-appearing.     Comments: NAD  Eyes:     Conjunctiva/sclera: Conjunctivae normal.     Pupils: Pupils are equal, round, and reactive to light.  Neck:     Thyroid : No thyromegaly.     Vascular: No JVD.  Cardiovascular:     Rate and Rhythm: Normal rate and regular rhythm.     Heart sounds: Normal heart sounds. No murmur heard.    No friction rub. No gallop.  Pulmonary:     Effort: Pulmonary effort is normal. No respiratory distress.     Breath sounds: Normal breath sounds. No wheezing or rales.  Chest:     Chest wall: No tenderness.  Abdominal:     General: Bowel sounds are normal. There is no distension.     Palpations: Abdomen is soft. There is no mass.     Tenderness: There is no abdominal tenderness. There is no guarding or rebound.  Musculoskeletal:        General: No tenderness. Normal range of motion.     Cervical back: Normal range of motion.  Lymphadenopathy:     Cervical: No cervical adenopathy.  Skin:    General: Skin is warm and dry.  Findings: No rash.  Neurological:     Mental Status: He is alert and oriented to person, place, and time.     Cranial Nerves: No cranial nerve deficit.     Motor: No abnormal muscle tone.     Coordination: Coordination normal.     Gait: Gait normal.     Deep Tendon Reflexes: Reflexes are normal and symmetric.  Psychiatric:        Behavior: Behavior normal.        Thought Content: Thought content normal.        Judgment: Judgment normal.     Lab Results  Component Value Date   WBC 5.8 09/09/2022   HGB 15.2 09/09/2022   HCT 44.6 09/09/2022   PLT 276.0 09/09/2022   GLUCOSE 100 (H) 09/09/2022   CHOL 144 09/09/2022   TRIG 61.0 09/09/2022   HDL 50.30 09/09/2022   LDLDIRECT 133.6 01/25/2012   LDLCALC 81  09/09/2022   ALT 14 09/09/2022   AST 17 09/09/2022   NA 136 09/09/2022   K 4.7 09/09/2022   CL 99 09/09/2022   CREATININE 0.69 09/09/2022   BUN 13 09/09/2022   CO2 30 09/09/2022   TSH 0.02 (L) 09/09/2022   PSA 1.87 09/09/2022   HGBA1C 5.6 06/19/2006    CT CARDIAC SCORING (SELF PAY ONLY) Addendum Date: 08/19/2021 ADDENDUM REPORT: 08/19/2021 13:06 EXAM: OVER-READ INTERPRETATION  CT CHEST The following report is an over-read performed by radiologist Dr. Ala Alice [facility] on 08/19/2021. This over-read does not include interpretation of cardiac or coronary anatomy or pathology. The coronary calcium  score interpretation by the cardiologist is attached. COMPARISON:  Chest radiograph 04/18/2016 FINDINGS: Minimal linear subsegmental atelectasis or scarring in the lower lobes and lingula. Mild thoracic spondylosis. No other significant extracardiac findings. IMPRESSION: 1. Bibasilar subsegmental atelectasis or scarring. Electronically Signed   By: Freida Jes M.D.   On: 08/19/2021 13:06   Result Date: 08/19/2021 CLINICAL DATA:  Cardiovascular Disease Risk stratification EXAM: Coronary Calcium  Score TECHNIQUE: A gated, non-contrast computed tomography scan of the heart was performed using 3mm slice thickness. Axial images were analyzed on a dedicated workstation. Calcium  scoring of the coronary arteries was performed using the Agatston method. FINDINGS: Coronary arteries: Normal origins. Coronary Calcium  Score: Left main: 0 Left anterior descending artery: 95 Left circumflex artery: 4 Right coronary artery: 9 Total: 108 Percentile: 51 Pericardium: Normal. Ascending Aorta: Normal caliber.  Aortic atherosclerosis. Non-cardiac: See separate report from [facility]. IMPRESSION: Coronary calcium  score of 108. This was 70 percentile for age-, race-, and sex-matched controls. RECOMMENDATIONS: Coronary artery calcium  (CAC) score is a strong predictor of incident coronary heart  disease (CHD) and provides predictive information beyond traditional risk factors. CAC scoring is reasonable to use in the decision to withhold, postpone, or initiate statin therapy in intermediate-risk or selected borderline-risk asymptomatic adults (age 81-75 years and LDL-C >=70 to <190 mg/dL) who do not have diabetes or established atherosclerotic cardiovascular disease (ASCVD).* In intermediate-risk (10-year ASCVD risk >=7.5% to <20%) adults or selected borderline-risk (10-year ASCVD risk >=5% to <7.5%) adults in whom a CAC score is measured for the purpose of making a treatment decision the following recommendations have been made: If CAC=0, it is reasonable to withhold statin therapy and reassess in 5 to 10 years, as long as higher risk conditions are absent (diabetes mellitus, family history of premature CHD in first degree relatives (males <55 years; females <65 years), cigarette smoking, or LDL >=190 mg/dL). If CAC is 1 to 99,  it is reasonable to initiate statin therapy for patients >=75 years of age. If CAC is >=100 or >=75th percentile, it is reasonable to initiate statin therapy at any age. Cardiology referral should be considered for patients with CAC scores >=400 or >=75th percentile. *2018 AHA/ACC/AACVPR/AAPA/ABC/ACPM/ADA/AGS/APhA/ASPC/NLA/PCNA Guideline on the Management of Blood Cholesterol: A Report of the American College of Cardiology/American Heart Association Task Force on Clinical Practice Guidelines. J Am Coll Cardiol. 2019;73(24):3168-3209. Electronically Signed: By: Dorothye Gathers M.D. On: 08/19/2021 11:16    Assessment & Plan:   Problem List Items Addressed This Visit     INSOMNIA, PERSISTENT   Valerian root       PSORIATIC ARTHRITIS   Dr Joanne Muckle Using [facility] Felling      PSORIASIS   Continue with current prescription therapy as reflected on the Med list. Using Taltz      Well adult exam - Primary   We discussed age appropriate health related issues, including  available/recomended screening tests and vaccinations. We discussed a need for adhering to healthy diet and exercise. Labs were ordered to be later reviewed . All questions were answered.  Cologuard 2015 neg Colon 2018 Dr Elvin Hammer, due in 2028 A cardiac CT scan for calcium  scoring offered 2/20         No orders of the defined types were placed in this encounter.     Follow-up: Return in about 1 year (around 09/13/2024) for Wellness Exam.  Anitra Barn, MD

## 2023-09-14 NOTE — Patient Instructions (Signed)
Valerian root for insomnia 

## 2023-09-15 ENCOUNTER — Ambulatory Visit: Payer: Medicare Other

## 2023-09-17 ENCOUNTER — Encounter: Payer: Self-pay | Admitting: Internal Medicine

## 2023-09-18 ENCOUNTER — Other Ambulatory Visit: Payer: Self-pay | Admitting: Internal Medicine

## 2023-09-26 IMAGING — CT CT CARDIAC CORONARY ARTERY CALCIUM SCORE
3 series · 14 of 20 positions shown, 16 images · non-contrast
Comparison: Chest radiograph 04/18/2016

Addendum:
CLINICAL DATA: Cardiovascular Disease Risk stratification

EXAM:
Coronary Calcium Score
TECHNIQUE: A gated, non-contrast computed tomography scan of the heart was
performed using 3mm slice thickness. Axial images were analyzed on a
dedicated workstation. Calcium scoring of the coronary arteries was
performed using the Agatston method.

[Series 2: cascseq 2.0 sa36 70% (id) · axial · 0.39mm/px · z∈[-217,-137]mm · 4 of 68 slices shown]
[im 14/68  vessel]
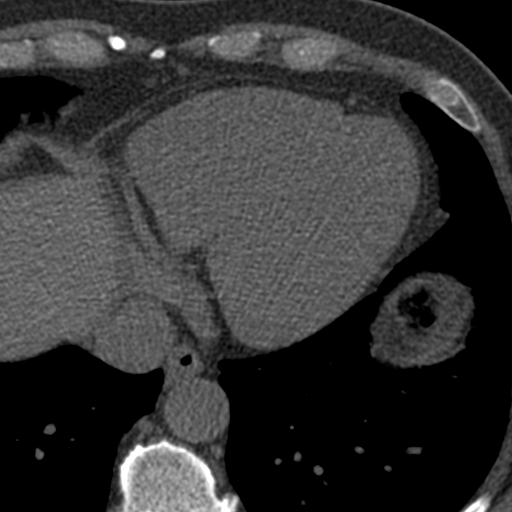
[im 27/68  vessel]
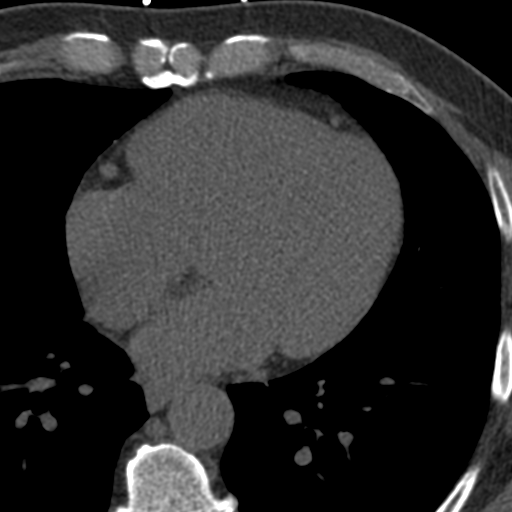
[im 41/68  vessel]
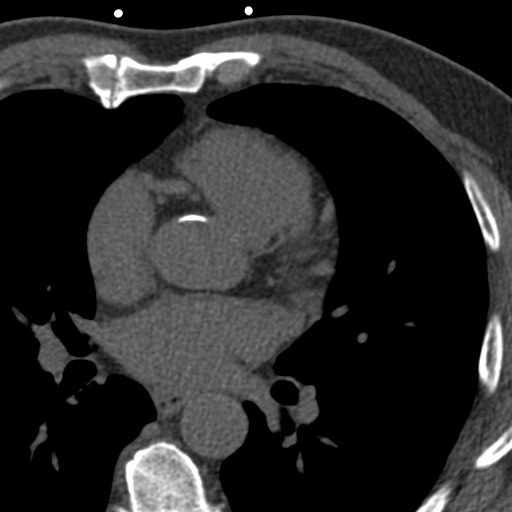
[im 54/68  vessel]
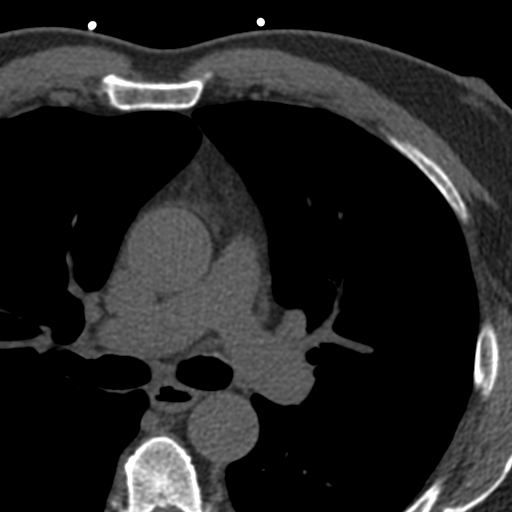

[Series 3: cascseq 2.0 bf37 st · axial · 0.71mm/px · z∈[-221,-133]mm · 5 of 68 slices shown, 7 images]
[im 12/68  vessel]
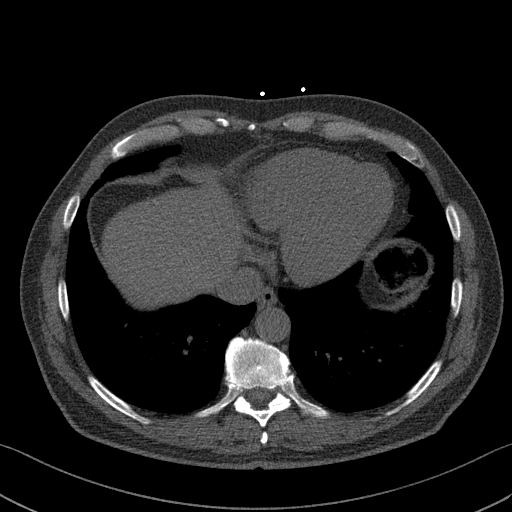
[im 12/68  lung]
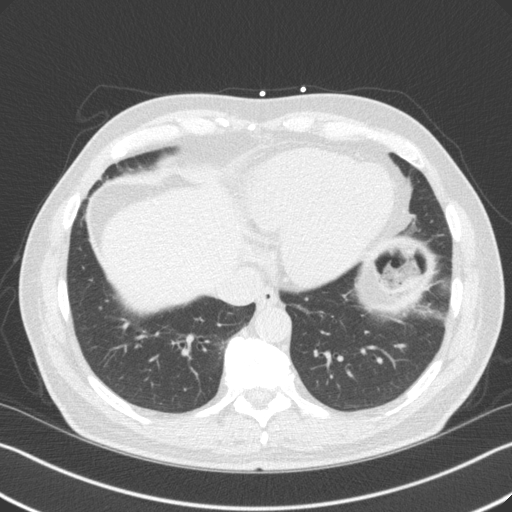
[im 23/68  vessel]
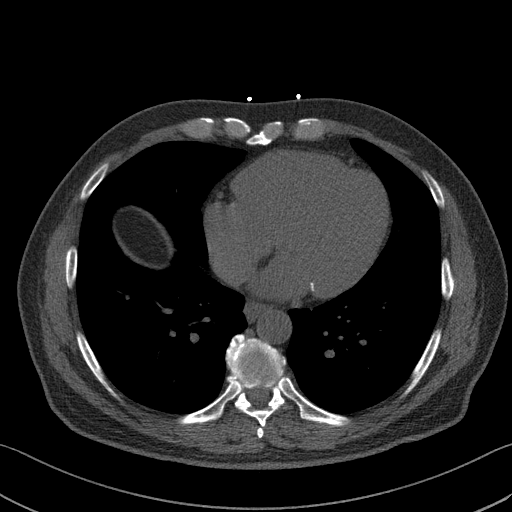
[im 34/68  vessel]
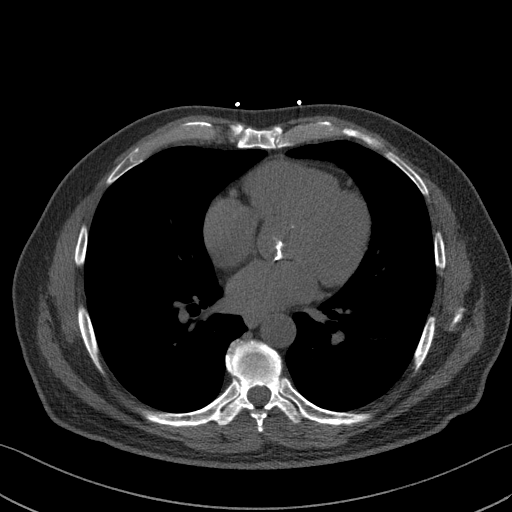
[im 45/68  vessel]
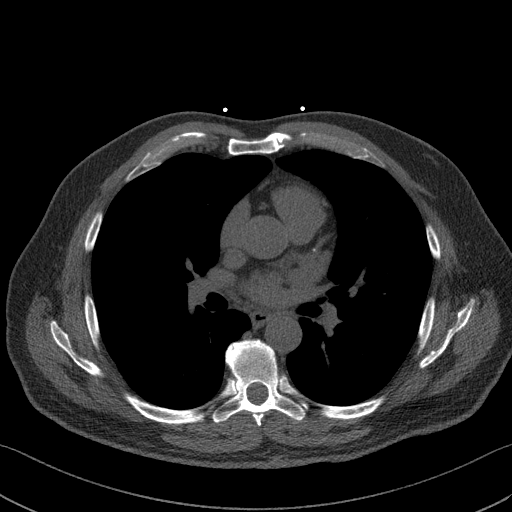
[im 56/68  vessel]
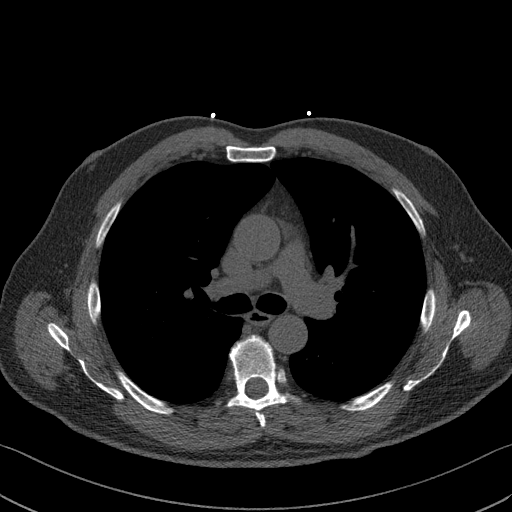
[im 56/68  lung]
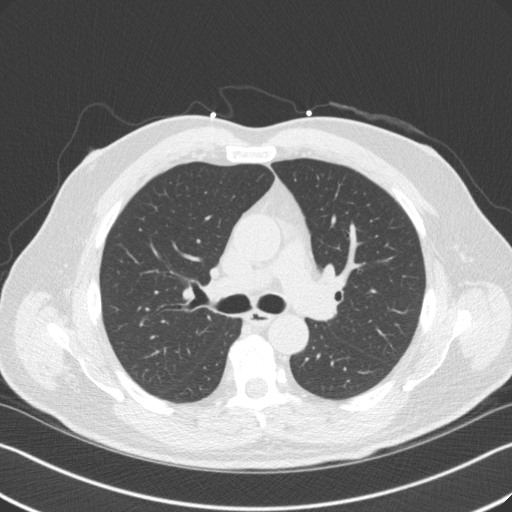

[Series 4: cascseq 2.0 br59 lung · axial · 0.71mm/px · z∈[-221,-133]mm · 5 of 68 slices shown]
[im 12/68  lung]
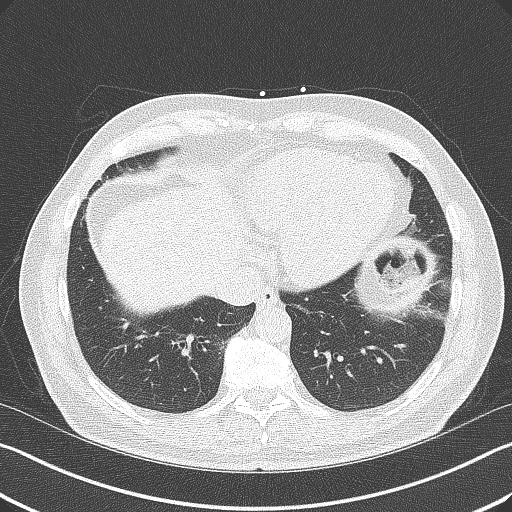
[im 23/68  lung]
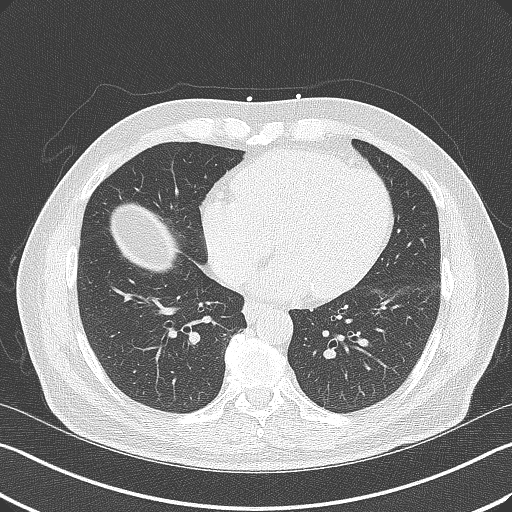
[im 34/68  lung]
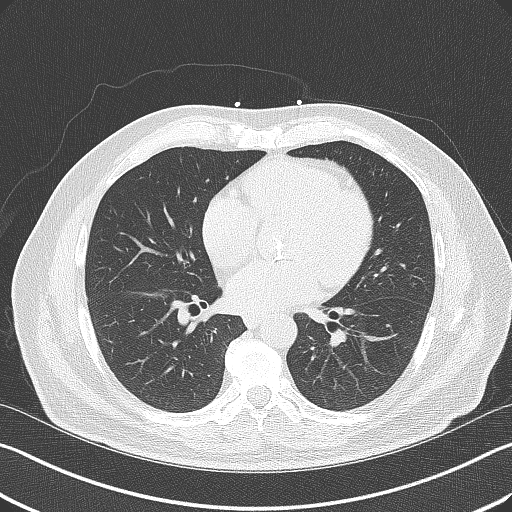
[im 45/68  lung]
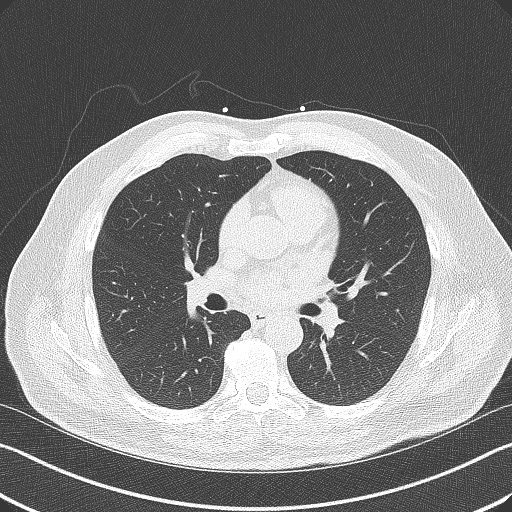
[im 56/68  lung]
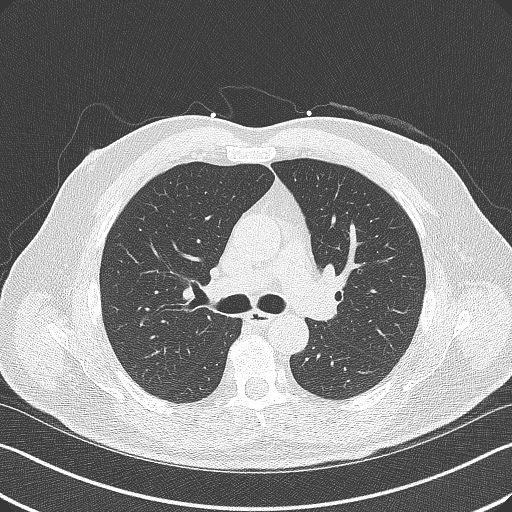

[14 of 20 positions shown; findings below may reference images not displayed]

FINDINGS: Coronary arteries: Normal origins.

Coronary Calcium Score:

Left main: 0

Left anterior descending artery: 95

Left circumflex artery: 4

Right coronary artery: 9

Total: 108

Percentile: 51

Pericardium: Normal.

Ascending Aorta: Normal caliber.  Aortic atherosclerosis.

Non-cardiac: See separate report from [REDACTED].
IMPRESSION: Coronary calcium score of 108. This was 51 percentile for age-,
race-, and sex-matched controls.



If CAC=0, it is reasonable to withhold statin therapy and reassess
in 5 to 10 years, as long as higher risk conditions are absent
(diabetes mellitus, family history of premature CHD in first degree
relatives (males <55 years; females <65 years), cigarette smoking,
or LDL >=190 mg/dL).

If CAC is 1 to 99, it is reasonable to initiate statin therapy for
patients >=55 years of age.

If CAC is >=100 or >=75th percentile, it is reasonable to initiate
statin therapy at any age.

Cardiology referral should be considered for patients with CAC
scores >=400 or >=75th percentile.

*8024 AHA/ACC/AACVPR/AAPA/ABC/DIJON/BELLE/TIGER/Landari/OTGER/TIGER/SENTENO
Guideline on the Management of Blood Cholesterol: A Report of the
American College of Cardiology/American Heart Association Task Force
on Clinical Practice Guidelines. J Am Coll Cardiol.
5767;73(24):2946-2802.

EXAM:
OVER-READ INTERPRETATION  CT CHEST

The following report is an over-read performed by radiologist Dr.
over-read does not include interpretation of cardiac or coronary
anatomy or pathology. The coronary calcium score interpretation by
the cardiologist is attached.
FINDINGS: Minimal linear subsegmental atelectasis or scarring in the lower
lobes and lingula. Mild thoracic spondylosis. No other significant
extracardiac findings.
IMPRESSION: 1. Bibasilar subsegmental atelectasis or scarring.

*** End of Addendum ***
FINDINGS: Coronary arteries: Normal origins.

Coronary Calcium Score:

Left main: 0

Left anterior descending artery: 95

Left circumflex artery: 4

Right coronary artery: 9

Total: 108

Percentile: 51

Pericardium: Normal.

Ascending Aorta: Normal caliber.  Aortic atherosclerosis.

Non-cardiac: See separate report from [REDACTED].
IMPRESSION: Coronary calcium score of 108. This was 51 percentile for age-,
race-, and sex-matched controls.



If CAC=0, it is reasonable to withhold statin therapy and reassess
in 5 to 10 years, as long as higher risk conditions are absent
(diabetes mellitus, family history of premature CHD in first degree
relatives (males <55 years; females <65 years), cigarette smoking,
or LDL >=190 mg/dL).

If CAC is 1 to 99, it is reasonable to initiate statin therapy for
patients >=55 years of age.

If CAC is >=100 or >=75th percentile, it is reasonable to initiate
statin therapy at any age.

Cardiology referral should be considered for patients with CAC
scores >=400 or >=75th percentile.

*8024 AHA/ACC/AACVPR/AAPA/ABC/DIJON/BELLE/TIGER/Landari/OTGER/TIGER/SENTENO
Guideline on the Management of Blood Cholesterol: A Report of the
American College of Cardiology/American Heart Association Task Force
on Clinical Practice Guidelines. J Am Coll Cardiol.
5767;73(24):2946-2802.

## 2023-11-08 ENCOUNTER — Other Ambulatory Visit: Payer: Self-pay | Admitting: Internal Medicine

## 2024-06-19 ENCOUNTER — Encounter: Payer: Self-pay | Admitting: Internal Medicine

## 2024-06-19 ENCOUNTER — Other Ambulatory Visit: Payer: Self-pay

## 2024-06-19 MED ORDER — ROSUVASTATIN CALCIUM 5 MG PO TABS: 5.0000 mg | ORAL_TABLET | Freq: Every day | ORAL | 3 refills | Status: DC

## 2024-06-19 MED ORDER — LEVOTHYROXINE SODIUM 125 MCG PO TABS: ORAL_TABLET | ORAL | 3 refills | Status: DC

## 2024-06-19 MED ORDER — ICOSAPENT ETHYL 1 G PO CAPS: 2.0000 g | ORAL_CAPSULE | Freq: Two times a day (BID) | ORAL | 3 refills | Status: DC

## 2024-06-19 NOTE — Telephone Encounter (Signed)
 Pt wife called in because they received a call from the [facility] pharmacy stating they had meds ready for pickup. She messaged this morning to let Dr Garald know that they changed pharmacy to [facility] (I have updated patient account) and need these meds sent to the [facility] instead. Please provide pt an update once this has been fixed.

## 2024-06-20 NOTE — Telephone Encounter (Signed)
 Patients wife called in stating medications   icosapent  Ethyl (VASCEPA ) 1 g capsule  levothyroxine  (SYNTHROID ) 125 MCG tablet  rosuvastatin  (CRESTOR ) 5 MG tablet  Were supposed to be sent to  [facility] [address], [place] - [address] Phone: [phone]  Fax: [phone]      However provider sent them to [facility] again.

## 2024-06-24 ENCOUNTER — Other Ambulatory Visit: Payer: Self-pay | Admitting: Internal Medicine

## 2024-06-24 MED ORDER — ROSUVASTATIN CALCIUM 5 MG PO TABS: 5.0000 mg | ORAL_TABLET | Freq: Every day | ORAL | 3 refills | Status: AC

## 2024-06-24 MED ORDER — LEVOTHYROXINE SODIUM 125 MCG PO TABS: ORAL_TABLET | ORAL | 3 refills | Status: AC

## 2024-06-24 MED ORDER — ICOSAPENT ETHYL 1 G PO CAPS: 2.0000 g | ORAL_CAPSULE | Freq: Two times a day (BID) | ORAL | 3 refills | Status: AC
